# Patient Record
Sex: Male | Born: 1950 | Race: White | Hispanic: No | Marital: Married | Smoking: Never smoker
Health system: Southern US, Community
[De-identification: ages and names within clinical notes are randomized; demographics above are authoritative.]

## PROBLEM LIST (undated history)

## (undated) DIAGNOSIS — E785 Hyperlipidemia, unspecified: Secondary | ICD-10-CM

## (undated) DIAGNOSIS — Z9889 Other specified postprocedural states: Secondary | ICD-10-CM

## (undated) DIAGNOSIS — R7303 Prediabetes: Secondary | ICD-10-CM

## (undated) DIAGNOSIS — T4145XA Adverse effect of unspecified anesthetic, initial encounter: Secondary | ICD-10-CM

## (undated) DIAGNOSIS — K635 Polyp of colon: Secondary | ICD-10-CM

## (undated) DIAGNOSIS — M1990 Unspecified osteoarthritis, unspecified site: Secondary | ICD-10-CM

## (undated) DIAGNOSIS — R112 Nausea with vomiting, unspecified: Secondary | ICD-10-CM

## (undated) DIAGNOSIS — T8859XA Other complications of anesthesia, initial encounter: Secondary | ICD-10-CM

## (undated) DIAGNOSIS — H269 Unspecified cataract: Secondary | ICD-10-CM

## (undated) DIAGNOSIS — E119 Type 2 diabetes mellitus without complications: Secondary | ICD-10-CM

## (undated) DIAGNOSIS — I10 Essential (primary) hypertension: Secondary | ICD-10-CM

## (undated) HISTORY — DX: Essential (primary) hypertension: I10

## (undated) HISTORY — DX: Polyp of colon: K63.5

## (undated) HISTORY — DX: Hyperlipidemia, unspecified: E78.5

## (undated) HISTORY — DX: Type 2 diabetes mellitus without complications: E11.9

## (undated) HISTORY — DX: Prediabetes: R73.03

## (undated) HISTORY — DX: Unspecified cataract: H26.9

## (undated) HISTORY — PX: JOINT REPLACEMENT: SHX530

---
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
## 2015-03-17 DIAGNOSIS — Z96643 Presence of artificial hip joint, bilateral: Secondary | ICD-10-CM | POA: Insufficient documentation

## 2015-07-20 ENCOUNTER — Encounter: Payer: Self-pay | Admitting: Family Medicine

## 2015-07-20 DIAGNOSIS — H269 Unspecified cataract: Secondary | ICD-10-CM | POA: Insufficient documentation

## 2015-07-20 DIAGNOSIS — I10 Essential (primary) hypertension: Secondary | ICD-10-CM | POA: Insufficient documentation

## 2015-07-25 ENCOUNTER — Ambulatory Visit (INDEPENDENT_AMBULATORY_CARE_PROVIDER_SITE_OTHER): Payer: Federal, State, Local not specified - PPO | Admitting: Family Medicine

## 2015-07-25 ENCOUNTER — Encounter: Payer: Self-pay | Admitting: Family Medicine

## 2015-07-25 VITALS — BP 162/84 | HR 74 | Temp 97.9°F | Resp 18 | Ht 72.0 in | Wt 235.0 lb

## 2015-07-25 DIAGNOSIS — I10 Essential (primary) hypertension: Secondary | ICD-10-CM | POA: Diagnosis not present

## 2015-07-25 DIAGNOSIS — Z0000 Encounter for general adult medical examination without abnormal findings: Secondary | ICD-10-CM

## 2015-07-25 DIAGNOSIS — Z7189 Other specified counseling: Secondary | ICD-10-CM | POA: Diagnosis not present

## 2015-07-25 DIAGNOSIS — Z7689 Persons encountering health services in other specified circumstances: Secondary | ICD-10-CM

## 2015-07-25 DIAGNOSIS — K635 Polyp of colon: Secondary | ICD-10-CM | POA: Insufficient documentation

## 2015-07-25 MED ORDER — LISINOPRIL 40 MG PO TABS: 40.0000 mg | ORAL_TABLET | Freq: Every day | ORAL | Status: DC

## 2015-07-25 NOTE — Progress Notes (Signed)
Subjective:    Patient ID: Ricky Lowe, male    DOB: 1950-11-02, 65 y.o.   MRN: [id number]  HPI Patient is here today to establish care. Has a past medical history of hypertension. He has been off medication for quite some time. His blood pressure is elevated today at 162/84. He denies any chest pain shortness of breath or dyspnea on exertion. In the past he was on lisinopril. Colonoscopy was performed in 2014. He did have colon polyps and is due again in 2019. He is overdue for a PSA as well as digital rectal exam. He is also due for the shingles vaccine. Tetanus shot is up-to-date. He is due for hepatitis C screening. Past Medical History  Diagnosis Date  . Cataract   . Hypertension   . Colon polyps     Dr. Sunny Schlein   Past Surgical History  Procedure Laterality Date  . Joint replacement  2010 / 2012    rt hip / lt hip   No current outpatient prescriptions on file prior to visit.   No current facility-administered medications on file prior to visit.   No Known Allergies Social History   Social History  . Marital Status: Married    Spouse Name: N/A  . Number of Children: N/A  . Years of Education: N/A   Occupational History  . Not on file.   Social History Main Topics  . Smoking status: Never Smoker   . Smokeless tobacco: Former Systems developer    Quit date: 07/19/1989  . Alcohol Use: Yes     Comment: occ glass of wine every few months  . Drug Use: No  . Sexual Activity: Yes    Birth Control/ Protection: None   Other Topics Concern  . Not on file   Social History Narrative   Family History  Problem Relation Age of Onset  . Diabetes Mother   . Hearing loss Mother   . Heart disease Mother   . Hyperlipidemia Mother   . Stroke Mother   . Vision loss Mother   . Hypertension Father   . Arthritis Maternal Grandmother       Review of Systems  All other systems reviewed and are negative.      Objective:   Physical Exam  Constitutional: He is oriented to person,  place, and time. He appears well-developed and well-nourished. No distress.  HENT:  Head: Normocephalic and atraumatic.  Right Ear: External ear normal.  Left Ear: External ear normal.  Nose: Nose normal.  Mouth/Throat: Oropharynx is clear and moist. No oropharyngeal exudate.  Eyes: Conjunctivae and EOM are normal. Pupils are equal, round, and reactive to light. Right eye exhibits no discharge. Left eye exhibits no discharge. No scleral icterus.  Neck: Normal range of motion. Neck supple. No JVD present. No tracheal deviation present. No thyromegaly present.  Cardiovascular: Normal rate, regular rhythm, normal heart sounds and intact distal pulses.  Exam reveals no gallop and no friction rub.   No murmur heard. Pulmonary/Chest: Effort normal and breath sounds normal. No stridor. No respiratory distress. He has no wheezes. He has no rales. He exhibits no tenderness.  Abdominal: Soft. Bowel sounds are normal. He exhibits no distension and no mass. There is no tenderness. There is no rebound and no guarding.  Genitourinary: Rectum normal, prostate normal and penis normal.  Musculoskeletal: Normal range of motion. He exhibits no edema or tenderness.  Lymphadenopathy:    He has no cervical adenopathy.  Neurological: He is alert and oriented to  person, place, and time. He has normal reflexes. He displays normal reflexes. No cranial nerve deficit. He exhibits normal muscle tone. Coordination normal.  Skin: Skin is warm. No rash noted. He is not diaphoretic. No erythema. No pallor.  Psychiatric: He has a normal mood and affect. His behavior is normal. Judgment and thought content normal.  Vitals reviewed.         Assessment & Plan:  Establishing care with new doctor, encounter for - Plan: COMPLETE METABOLIC PANEL WITH GFR, CBC with Differential/Platelet, Hepatitis C Ab Reflex HCV RNA, QUANT, PSA, Lipid panel  Benign essential HTN - Plan: lisinopril (PRINIVIL,ZESTRIL) 40 MG tablet  Routine  general medical examination at a health care facility - Plan: COMPLETE METABOLIC PANEL WITH GFR, CBC with Differential/Platelet, Hepatitis C Ab Reflex HCV RNA, QUANT, PSA, Lipid panel  Begin lisinopril 40 mg by mouth daily and recheck blood pressure in one month. I recommended a shingles vaccine. Colonoscopy is up-to-date. I will check a CBC, CMP, fasting lipid panel, and a PSA. I will also screen the patient for hepatitis C.

## 2015-07-26 ENCOUNTER — Encounter: Payer: Self-pay | Admitting: Family Medicine

## 2015-07-26 LAB — LIPID PANEL
CHOL/HDL RATIO: 5.5 ratio — AB (ref <=5.0)
CHOLESTEROL: 205 mg/dL — AB (ref 125–200)
HDL: 37 mg/dL — ABNORMAL LOW (ref >=40)
LDL Cholesterol: 115 mg/dL (ref <130)
Triglycerides: 266 mg/dL — ABNORMAL HIGH (ref <150)
VLDL: 53 mg/dL — ABNORMAL HIGH (ref <30)

## 2015-07-26 LAB — COMPLETE METABOLIC PANEL WITH GFR
ALK PHOS: 47 U/L (ref 40–115)
ALT: 56 U/L — ABNORMAL HIGH (ref 9–46)
AST: 35 U/L (ref 10–35)
Albumin: 4.6 g/dL (ref 3.6–5.1)
BUN: 17 mg/dL (ref 7–25)
CO2: 26 mmol/L (ref 20–31)
Calcium: 9.8 mg/dL (ref 8.6–10.3)
Chloride: 101 mmol/L (ref 98–110)
Creat: 0.92 mg/dL (ref 0.70–1.25)
GFR, Est Non African American: 89 mL/min (ref >=60)
GLUCOSE: 113 mg/dL — AB (ref 70–99)
POTASSIUM: 4.4 mmol/L (ref 3.5–5.3)
SODIUM: 139 mmol/L (ref 135–146)
Total Bilirubin: 0.6 mg/dL (ref 0.2–1.2)
Total Protein: 7.1 g/dL (ref 6.1–8.1)

## 2015-07-26 LAB — CBC WITH DIFFERENTIAL/PLATELET
BASOS ABS: 0 {cells}/uL (ref 0–200)
BASOS PCT: 0 %
EOS ABS: 118 {cells}/uL (ref 15–500)
EOS PCT: 2 %
HCT: 45.4 % (ref 38.5–50.0)
HEMOGLOBIN: 15.3 g/dL (ref 13.0–17.0)
LYMPHS ABS: 1711 {cells}/uL (ref 850–3900)
Lymphocytes Relative: 29 %
MCH: 29.7 pg (ref 27.0–33.0)
MCHC: 33.7 g/dL (ref 32.0–36.0)
MCV: 88 fL (ref 80.0–100.0)
MONOS PCT: 7 %
MPV: 9.6 fL (ref 7.5–12.5)
Monocytes Absolute: 413 cells/uL (ref 200–950)
NEUTROS ABS: 3658 {cells}/uL (ref 1500–7800)
Neutrophils Relative %: 62 %
PLATELETS: 177 10*3/uL (ref 140–400)
RBC: 5.16 MIL/uL (ref 4.20–5.80)
RDW: 13.3 % (ref 11.0–15.0)
WBC: 5.9 10*3/uL (ref 3.8–10.8)

## 2015-07-26 LAB — HEPATITIS C ANTIBODY: HCV Ab: NEGATIVE

## 2015-07-26 LAB — PSA: PSA: 0.58 ng/mL (ref <=4.00)

## 2016-01-27 ENCOUNTER — Encounter: Payer: Self-pay | Admitting: Family Medicine

## 2016-01-27 ENCOUNTER — Ambulatory Visit (INDEPENDENT_AMBULATORY_CARE_PROVIDER_SITE_OTHER): Payer: Federal, State, Local not specified - PPO | Admitting: Family Medicine

## 2016-01-27 VITALS — BP 130/82 | HR 68 | Temp 97.8°F | Resp 16 | Ht 72.0 in | Wt 233.0 lb

## 2016-01-27 DIAGNOSIS — I10 Essential (primary) hypertension: Secondary | ICD-10-CM

## 2016-01-27 DIAGNOSIS — E785 Hyperlipidemia, unspecified: Secondary | ICD-10-CM

## 2016-01-27 DIAGNOSIS — Z23 Encounter for immunization: Secondary | ICD-10-CM

## 2016-01-27 DIAGNOSIS — R7303 Prediabetes: Secondary | ICD-10-CM

## 2016-01-27 NOTE — Progress Notes (Signed)
Subjective:    Patient ID: Ricky Lowe, male    DOB: 03-Jun-1950, 65 y.o.   MRN: [id number]  HPI  07/25/15 Patient is here today to establish care. Has a past medical history of hypertension. He has been off medication for quite some time. His blood pressure is elevated today at 162/84. He denies any chest pain shortness of breath or dyspnea on exertion. In the past he was on lisinopril. Colonoscopy was performed in 2014. He did have colon polyps and is due again in 2019. He is overdue for a PSA as well as digital rectal exam. He is also due for the shingles vaccine. Tetanus shot is up-to-date. He is due for hepatitis C screening.  At that time, my plan was: Begin lisinopril 40 mg by mouth daily and recheck blood pressure in one month. I recommended a shingles vaccine. Colonoscopy is up-to-date. I will check a CBC, CMP, fasting lipid panel, and a PSA. I will also screen the patient for hepatitis C.  01/27/16 His blood pressure has been doing outstanding. Blood pressure at home is typically 125-130/70-80. He is tolerating the lisinopril well without any coughing his lab work at his last visit was significant for prediabetes with an elevated triglyceride and low HDL cholesterol. He admits that he has not been exercising regularly. He is trying to watch his diet some. Past Medical History:  Diagnosis Date  . Cataract   . Colon polyps    Dr. Sunny Schlein  . Hypertension    Past Surgical History:  Procedure Laterality Date  . JOINT REPLACEMENT  2010 / 2012   rt hip / lt hip   Current Outpatient Prescriptions on File Prior to Visit  Medication Sig Dispense Refill  . aspirin 81 MG tablet Take 81 mg by mouth daily.    Marland Kitchen lisinopril (PRINIVIL,ZESTRIL) 40 MG tablet Take 1 tablet (40 mg total) by mouth daily. 90 tablet 3   No current facility-administered medications on file prior to visit.    No Known Allergies Social History   Social History  . Marital status: Married    Spouse name: N/A  .  Number of children: N/A  . Years of education: N/A   Occupational History  . Not on file.   Social History Main Topics  . Smoking status: Never Smoker  . Smokeless tobacco: Former Systems developer    Quit date: 07/19/1989  . Alcohol use Yes     Comment: occ glass of wine every few months  . Drug use: No  . Sexual activity: Yes    Birth control/ protection: None   Other Topics Concern  . Not on file   Social History Narrative  . No narrative on file   Family History  Problem Relation Age of Onset  . Diabetes Mother   . Hearing loss Mother   . Heart disease Mother   . Hyperlipidemia Mother   . Stroke Mother   . Vision loss Mother   . Hypertension Father   . Arthritis Maternal Grandmother       Review of Systems  All other systems reviewed and are negative.      Objective:   Physical Exam  Constitutional: He is oriented to person, place, and time. He appears well-developed and well-nourished. No distress.  HENT:  Head: Normocephalic and atraumatic.  Right Ear: External ear normal.  Left Ear: External ear normal.  Nose: Nose normal.  Mouth/Throat: Oropharynx is clear and moist. No oropharyngeal exudate.  Eyes: Conjunctivae and EOM are normal.  Pupils are equal, round, and reactive to light. Right eye exhibits no discharge. Left eye exhibits no discharge. No scleral icterus.  Neck: Normal range of motion. Neck supple. No JVD present. No tracheal deviation present. No thyromegaly present.  Cardiovascular: Normal rate, regular rhythm, normal heart sounds and intact distal pulses.  Exam reveals no gallop and no friction rub.   No murmur heard. Pulmonary/Chest: Effort normal and breath sounds normal. No stridor. No respiratory distress. He has no wheezes. He has no rales. He exhibits no tenderness.  Abdominal: Soft. Bowel sounds are normal. He exhibits no distension and no mass. There is no tenderness. There is no rebound and no guarding.  Genitourinary: Rectum normal, prostate normal  and penis normal.  Musculoskeletal: Normal range of motion. He exhibits no edema or tenderness.  Lymphadenopathy:    He has no cervical adenopathy.  Neurological: He is alert and oriented to person, place, and time. He has normal reflexes. No cranial nerve deficit. He exhibits normal muscle tone. Coordination normal.  Skin: Skin is warm. No rash noted. He is not diaphoretic. No erythema. No pallor.  Psychiatric: He has a normal mood and affect. His behavior is normal. Judgment and thought content normal.  Vitals reviewed.         Assessment & Plan:  Essential hypertension, dyslipidemia, prediabetes.  Blood pressure is excellent. I'll make no changes in his lisinopril dose. Return fasting to check a CMP, fasting lipid panel, and hemoglobin A1c. We discussed low carbohydrate diet today as well as 30 minutes a day 5 days a week of aerobic exercise to help manage his prediabetes and his dyslipidemia. He received his flu shot today

## 2016-01-27 NOTE — Addendum Note (Signed)
Addended by: Shary Decamp B on: 01/27/2016 12:59 PM   Modules accepted: Orders

## 2016-02-01 ENCOUNTER — Other Ambulatory Visit: Payer: Federal, State, Local not specified - PPO

## 2016-02-01 DIAGNOSIS — R7303 Prediabetes: Secondary | ICD-10-CM | POA: Diagnosis not present

## 2016-02-01 LAB — COMPLETE METABOLIC PANEL WITH GFR
ALBUMIN: 4.6 g/dL (ref 3.6–5.1)
ALK PHOS: 41 U/L (ref 40–115)
ALT: 51 U/L — ABNORMAL HIGH (ref 9–46)
AST: 74 U/L — AB (ref 10–35)
BILIRUBIN TOTAL: 0.6 mg/dL (ref 0.2–1.2)
BUN: 14 mg/dL (ref 7–25)
CALCIUM: 9.4 mg/dL (ref 8.6–10.3)
CO2: 27 mmol/L (ref 20–31)
CREATININE: 0.95 mg/dL (ref 0.70–1.25)
Chloride: 102 mmol/L (ref 98–110)
GFR, Est African American: >89 mL/min (ref >=60)
GFR, Est Non African American: 85 mL/min (ref >=60)
Glucose, Bld: 109 mg/dL — ABNORMAL HIGH (ref 70–99)
POTASSIUM: 4.3 mmol/L (ref 3.5–5.3)
Sodium: 138 mmol/L (ref 135–146)
TOTAL PROTEIN: 6.7 g/dL (ref 6.1–8.1)

## 2016-02-01 LAB — CBC WITH DIFFERENTIAL/PLATELET
BASOS ABS: 51 {cells}/uL (ref 0–200)
Basophils Relative: 1 %
Eosinophils Absolute: 153 cells/uL (ref 15–500)
Eosinophils Relative: 3 %
HEMATOCRIT: 45.4 % (ref 38.5–50.0)
HEMOGLOBIN: 15.3 g/dL (ref 13.0–17.0)
LYMPHS ABS: 1938 {cells}/uL (ref 850–3900)
Lymphocytes Relative: 38 %
MCH: 29.6 pg (ref 27.0–33.0)
MCHC: 33.7 g/dL (ref 32.0–36.0)
MCV: 87.8 fL (ref 80.0–100.0)
MONO ABS: 459 {cells}/uL (ref 200–950)
MPV: 9.3 fL (ref 7.5–12.5)
Monocytes Relative: 9 %
NEUTROS ABS: 2499 {cells}/uL (ref 1500–7800)
NEUTROS PCT: 49 %
Platelets: 174 10*3/uL (ref 140–400)
RBC: 5.17 MIL/uL (ref 4.20–5.80)
RDW: 12.6 % (ref 11.0–15.0)
WBC: 5.1 10*3/uL (ref 3.8–10.8)

## 2016-02-01 LAB — LIPID PANEL
Cholesterol: 210 mg/dL — ABNORMAL HIGH (ref 125–200)
HDL: 39 mg/dL — ABNORMAL LOW (ref >=40)
LDL Cholesterol: 142 mg/dL — ABNORMAL HIGH (ref <130)
Total CHOL/HDL Ratio: 5.4 Ratio — ABNORMAL HIGH (ref <=5.0)
Triglycerides: 144 mg/dL (ref <150)
VLDL: 29 mg/dL (ref <30)

## 2016-02-02 LAB — HEMOGLOBIN A1C
Hgb A1c MFr Bld: 5.5 % (ref <5.7)
MEAN PLASMA GLUCOSE: 111 mg/dL

## 2016-02-08 ENCOUNTER — Other Ambulatory Visit: Payer: Self-pay | Admitting: Family Medicine

## 2016-02-08 DIAGNOSIS — R945 Abnormal results of liver function studies: Secondary | ICD-10-CM

## 2016-02-08 NOTE — Addendum Note (Signed)
Addended by: Shary Decamp B on: 02/08/2016 11:09 AM   Modules accepted: Orders

## 2016-02-15 ENCOUNTER — Ambulatory Visit (HOSPITAL_COMMUNITY)
Admission: RE | Admit: 2016-02-15 | Discharge: 2016-02-15 | Disposition: A | Discharge status: Home / Self Care | Payer: Federal, State, Local not specified - PPO | Source: Ambulatory Visit | Attending: Family Medicine | Admitting: Family Medicine

## 2016-02-15 DIAGNOSIS — R945 Abnormal results of liver function studies: Secondary | ICD-10-CM | POA: Diagnosis not present

## 2016-02-15 DIAGNOSIS — R7989 Other specified abnormal findings of blood chemistry: Secondary | ICD-10-CM | POA: Diagnosis not present

## 2016-02-15 DIAGNOSIS — K760 Fatty (change of) liver, not elsewhere classified: Secondary | ICD-10-CM | POA: Insufficient documentation

## 2016-02-17 ENCOUNTER — Encounter: Payer: Self-pay | Admitting: Family Medicine

## 2016-08-02 ENCOUNTER — Other Ambulatory Visit: Payer: Self-pay | Admitting: Family Medicine

## 2016-08-02 DIAGNOSIS — I10 Essential (primary) hypertension: Secondary | ICD-10-CM

## 2016-08-08 ENCOUNTER — Ambulatory Visit (INDEPENDENT_AMBULATORY_CARE_PROVIDER_SITE_OTHER): Payer: Federal, State, Local not specified - PPO | Admitting: Family Medicine

## 2016-08-08 ENCOUNTER — Encounter: Payer: Self-pay | Admitting: Family Medicine

## 2016-08-08 VITALS — BP 136/82 | HR 66 | Temp 98.4°F | Resp 12 | Ht 72.0 in | Wt 226.0 lb

## 2016-08-08 DIAGNOSIS — R509 Fever, unspecified: Secondary | ICD-10-CM | POA: Diagnosis not present

## 2016-08-08 DIAGNOSIS — R590 Localized enlarged lymph nodes: Secondary | ICD-10-CM

## 2016-08-08 DIAGNOSIS — M2550 Pain in unspecified joint: Secondary | ICD-10-CM

## 2016-08-08 DIAGNOSIS — J020 Streptococcal pharyngitis: Secondary | ICD-10-CM | POA: Diagnosis not present

## 2016-08-08 DIAGNOSIS — N529 Male erectile dysfunction, unspecified: Secondary | ICD-10-CM

## 2016-08-08 LAB — STREP GROUP A AG, W/REFLEX TO CULT: STREGTOCOCCUS GROUP A AG SCREEN: DETECTED — AB

## 2016-08-08 MED ORDER — AMOXICILLIN 500 MG PO CAPS: 500.0000 mg | ORAL_CAPSULE | Freq: Two times a day (BID) | ORAL | 0 refills | Status: DC

## 2016-08-08 MED ORDER — SILDENAFIL CITRATE 100 MG PO TABS: 100.0000 mg | ORAL_TABLET | Freq: Every day | ORAL | 2 refills | Status: DC | PRN

## 2016-08-08 NOTE — Patient Instructions (Addendum)
Start amoxicillin for 10 days  We will call with lab results I a checking for lyme, bacterial infection, autoimmune disorder F/U pending results  Rachael Darby- Due for Physical/Fasting labs

## 2016-08-08 NOTE — Progress Notes (Signed)
Subjective:    Patient ID: Ricky Lowe, male    DOB: [birth year], 66 y.o.   MRN: [id number]  Patient presents for Illness (x4 days- fever/ chills, swollen lymph nodes in throat, sore throat, joint pain, HA- is concerned b/c he has had lime disease in past)  Recurrent episodes of fever, chills, fatigue , joint pain over the past 2 months. Last eipsode started on Monday, with fever, sore throat, swollen lymph nodes. No known sick contacts  He also gets swelling in hands and stiffness in joints Works outside, no recent tick bites but has had lyme disesae with class rash presentation about 10 years ago.  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  His immuniations are utd Each episodes he has taken ibuprofen which helps the joiunt pain Currently being treated with lisinopril for HTN and he has changed diet, on [employer] for fatty liver disesae the past 3 months    Review Of Systems:  GEN- denies fatigue, +fever, weight loss,weakness, recent illness HEENT- denies eye drainage, change in vision, nasal discharge, CVS- denies chest pain, palpitations RESP- denies SOB, +cough, wheeze ABD- denies N/V, change in stools, abd pain GU- denies dysuria, hematuria, dribbling, incontinence MSK- + joint pain, =muscle aches, injury Neuro- denies headache, dizziness, syncope, seizure activity       Objective:    BP 136/82   Pulse 66   Temp 98.4 F (36.9 C) (Oral)   Resp 12   Ht 6' (1.829 m)   Wt 226 lb (102.5 kg)   SpO2 99%   BMI 30.65 kg/m  GEN- NAD, alert and oriented x3 HEENT- PERRL, EOMI, non injected sclera, pink conjunctiva, MMM, oropharynx mild injection,  Small pustule right tonsilTM clear bilat no effusion,no maxillary sinus tenderness,nares clear  Neck- Supple,   ,moderate size submandibular and ant LAD CVS- RRR, no murmur RESP-CTAB ABD-NABS,sof  NT,ND, no HSM MSK- few small nodules at DIP 4th digits, nodule left wrist Skin- in tact no rash  EXT- No edema Pulses- Radial 2+  + strep        Assessment & Plan:      Problem List Items Addressed This Visit    Erectile dysfunction    Other Visit Diagnoses    Cervical lymphadenopathy    -  Primary   Relevant Orders   CBC with Differential/Platelet   Comprehensive metabolic panel   Epstein-Barr Virus VCA Antibody Panel   STREP GROUP A AG, W/REFLEX TO CULT (Completed)   Strep pharyngitis       Treat positive strep, which accounts for this illness but not the intermittant fevers joint swelling or stiffness, amox x 10 days   Relevant Orders   CBC with Differential/Platelet   Comprehensive metabolic panel   Rheumatoid factor   Sedimentation rate   C-reactive protein   B. Burgdorfi Antibodies by WB   Epstein-Barr Virus VCA Antibody Panel   STREP GROUP A AG, W/REFLEX TO CULT (Completed)   Arthralgia, unspecified joint  (Chronic)      Check Autoimmune labs, EBV/Lyme titers for recurrent episodes of fever, joint swelling and node swelling Also with his early hip swelling an nodules on hand and wrist noted past few years    Relevant Orders  Rheumatoid factor   Sedimentation rate   C-reactive protein   B. Burgdorfi Antibodies by WB      Note: This dictation was prepared with Dragon dictation along with smaller phrase technology. Any transcriptional errors that result from this process are unintentional.

## 2016-08-09 LAB — COMPREHENSIVE METABOLIC PANEL
ALBUMIN: 4.2 g/dL (ref 3.6–5.1)
ALT: 49 U/L — ABNORMAL HIGH (ref 9–46)
AST: 36 U/L — AB (ref 10–35)
Alkaline Phosphatase: 43 U/L (ref 40–115)
BILIRUBIN TOTAL: 0.5 mg/dL (ref 0.2–1.2)
BUN: 16 mg/dL (ref 7–25)
CALCIUM: 9.5 mg/dL (ref 8.6–10.3)
CO2: 25 mmol/L (ref 20–31)
Chloride: 103 mmol/L (ref 98–110)
Creat: 1.05 mg/dL (ref 0.70–1.25)
Glucose, Bld: 89 mg/dL (ref 70–99)
Potassium: 4.7 mmol/L (ref 3.5–5.3)
Sodium: 139 mmol/L (ref 135–146)
Total Protein: 6.6 g/dL (ref 6.1–8.1)

## 2016-08-09 LAB — EPSTEIN-BARR VIRUS VCA ANTIBODY PANEL: EBV NA IgG: 171 U/mL — ABNORMAL HIGH

## 2016-08-09 LAB — CBC WITH DIFFERENTIAL/PLATELET
BASOS ABS: 0 {cells}/uL (ref 0–200)
Basophils Relative: 0 %
Eosinophils Absolute: 204 cells/uL (ref 15–500)
Eosinophils Relative: 3 %
HEMATOCRIT: 40.7 % (ref 38.5–50.0)
HEMOGLOBIN: 13.7 g/dL (ref 13.0–17.0)
LYMPHS ABS: 1632 {cells}/uL (ref 850–3900)
Lymphocytes Relative: 24 %
MCH: 29.7 pg (ref 27.0–33.0)
MCHC: 33.7 g/dL (ref 32.0–36.0)
MCV: 88.1 fL (ref 80.0–100.0)
MONO ABS: 748 {cells}/uL (ref 200–950)
MPV: 10.1 fL (ref 7.5–12.5)
Monocytes Relative: 11 %
NEUTROS PCT: 62 %
Neutro Abs: 4216 cells/uL (ref 1500–7800)
Platelets: 177 10*3/uL (ref 140–400)
RBC: 4.62 MIL/uL (ref 4.20–5.80)
RDW: 13 % (ref 11.0–15.0)
WBC: 6.8 10*3/uL (ref 3.8–10.8)

## 2016-08-09 LAB — SEDIMENTATION RATE: SED RATE: 11 mm/h (ref 0–20)

## 2016-08-09 LAB — RHEUMATOID FACTOR

## 2016-08-09 LAB — C-REACTIVE PROTEIN: CRP: 64.1 mg/L — AB (ref <8.0)

## 2016-08-10 LAB — LYME ABY, WSTRN BLT IGG & IGM W/BANDS
B BURGDORFERI IGG ABS (IB): NEGATIVE
B BURGDORFERI IGM ABS (IB): NEGATIVE
LYME DISEASE 23 KD IGG: NONREACTIVE
LYME DISEASE 23 KD IGM: NONREACTIVE
LYME DISEASE 39 KD IGG: NONREACTIVE
LYME DISEASE 41 KD IGG: NONREACTIVE
LYME DISEASE 45 KD IGG: NONREACTIVE
LYME DISEASE 93 KD IGG: NONREACTIVE
Lyme Disease 18 kD IgG: NONREACTIVE
Lyme Disease 28 kD IgG: NONREACTIVE
Lyme Disease 30 kD IgG: NONREACTIVE
Lyme Disease 39 kD IgM: NONREACTIVE
Lyme Disease 41 kD IgM: NONREACTIVE
Lyme Disease 58 kD IgG: NONREACTIVE
Lyme Disease 66 kD IgG: NONREACTIVE

## 2016-10-01 ENCOUNTER — Other Ambulatory Visit: Payer: Self-pay | Admitting: Family Medicine

## 2016-10-01 MED ORDER — AMOXICILLIN 500 MG PO CAPS: 500.0000 mg | ORAL_CAPSULE | Freq: Two times a day (BID) | ORAL | 0 refills | Status: DC

## 2016-10-01 NOTE — Progress Notes (Signed)
Pt wife here, he is working and unable to come in Has sinus pressure drainage for past 2 weeks, no fever Using OTC meds not clearing  Will Rx amox, can use nasal steroid/nasal saline, anti-histamine  He should schedule visit if not improving

## 2016-10-05 ENCOUNTER — Telehealth: Payer: Self-pay | Admitting: Family Medicine

## 2016-10-05 NOTE — Telephone Encounter (Signed)
Patients wife Marcie Bal calling regarding some issues he is having maybe from the amoxicillin that was prescribed  Please call her at [phone]

## 2016-10-09 NOTE — Telephone Encounter (Signed)
I spoke with Ricky Lowe and he stated he was not having any problems with the amoxicillin. When I informed pt that his wife had called pt states he wasn't sure why she would call because he was not having any problems with the medication.

## 2016-11-08 DIAGNOSIS — D3131 Benign neoplasm of right choroid: Secondary | ICD-10-CM | POA: Diagnosis not present

## 2016-11-08 DIAGNOSIS — H25813 Combined forms of age-related cataract, bilateral: Secondary | ICD-10-CM | POA: Diagnosis not present

## 2016-11-29 ENCOUNTER — Encounter (INDEPENDENT_AMBULATORY_CARE_PROVIDER_SITE_OTHER): Payer: Federal, State, Local not specified - PPO | Admitting: Ophthalmology

## 2016-11-29 DIAGNOSIS — H35033 Hypertensive retinopathy, bilateral: Secondary | ICD-10-CM | POA: Diagnosis not present

## 2016-11-29 DIAGNOSIS — H2513 Age-related nuclear cataract, bilateral: Secondary | ICD-10-CM

## 2016-11-29 DIAGNOSIS — H43813 Vitreous degeneration, bilateral: Secondary | ICD-10-CM

## 2016-11-29 DIAGNOSIS — D3131 Benign neoplasm of right choroid: Secondary | ICD-10-CM | POA: Diagnosis not present

## 2016-11-29 DIAGNOSIS — I10 Essential (primary) hypertension: Secondary | ICD-10-CM | POA: Diagnosis not present

## 2016-12-10 NOTE — Patient Instructions (Signed)
Your procedure is scheduled on: 12/21/2016   Report to [facility] at   850   AM.  Call this number if you have problems the morning of surgery: [phone]   Do not eat food or drink liquids :After Midnight.      Take these medicines the morning of surgery with A SIP OF WATER: lisinopril   Do not wear jewelry, make-up or nail polish.  Do not wear lotions, powders, or perfumes. You may wear deodorant.  Do not shave 48 hours prior to surgery.  Do not bring valuables to the hospital.  Contacts, dentures or bridgework may not be worn into surgery.  Leave suitcase in the car. After surgery it may be brought to your room.  For patients admitted to the hospital, checkout time is 11:00 AM the day of discharge.   Patients discharged the day of surgery will not be allowed to drive home.  :     Please read over the following fact sheets that you were given: Coughing and Deep Breathing, Surgical Site Infection Prevention, Anesthesia Post-op Instructions and Care and Recovery After Surgery    Cataract A cataract is a clouding of the lens of the eye. When a lens becomes cloudy, vision is reduced based on the degree and nature of the clouding. Many cataracts reduce vision to some degree. Some cataracts make people more near-sighted as they develop. Other cataracts increase glare. Cataracts that are ignored and become worse can sometimes look white. The white color can be seen through the pupil. CAUSES   Aging. However, cataracts may occur at any age, even in newborns.   Certain drugs.   Trauma to the eye.   Certain diseases such as diabetes.   Specific eye diseases such as chronic inflammation inside the eye or a sudden attack of a rare form of glaucoma.   Inherited or acquired medical problems.  SYMPTOMS   Gradual, progressive drop in vision in the affected eye.   Severe, rapid visual loss. This most often happens when trauma is the cause.  DIAGNOSIS  To detect a cataract, an eye doctor  examines the lens. Cataracts are best diagnosed with an exam of the eyes with the pupils enlarged (dilated) by drops.  TREATMENT  For an early cataract, vision may improve by using different eyeglasses or stronger lighting. If that does not help your vision, surgery is the only effective treatment. A cataract needs to be surgically removed when vision loss interferes with your everyday activities, such as driving, reading, or watching TV. A cataract may also have to be removed if it prevents examination or treatment of another eye problem. Surgery removes the cloudy lens and usually replaces it with a substitute lens (intraocular lens, IOL).  At a time when both you and your doctor agree, the cataract will be surgically removed. If you have cataracts in both eyes, only one is usually removed at a time. This allows the operated eye to heal and be out of danger from any possible problems after surgery (such as infection or poor wound healing). In rare cases, a cataract may be doing damage to your eye. In these cases, your caregiver may advise surgical removal right away. The vast majority of people who have cataract surgery have better vision afterward. HOME CARE INSTRUCTIONS  If you are not planning surgery, you may be asked to do the following:  Use different eyeglasses.   Use stronger or brighter lighting.   Ask your eye doctor about reducing your  medicine dose or changing medicines if it is thought that a medicine caused your cataract. Changing medicines does not make the cataract go away on its own.   Become familiar with your surroundings. Poor vision can lead to injury. Avoid bumping into things on the affected side. You are at a higher risk for tripping or falling.   Exercise extreme care when driving or operating machinery.   Wear sunglasses if you are sensitive to bright light or experiencing problems with glare.  SEEK IMMEDIATE MEDICAL CARE IF:   You have a worsening or sudden vision  loss.   You notice redness, swelling, or increasing pain in the eye.   You have a fever.  Document Released: 04/02/2005 Document Revised: 03/22/2011 Document Reviewed: 11/24/2010 [facility] Patient Information 2012 [employer].PATIENT INSTRUCTIONS POST-ANESTHESIA  IMMEDIATELY FOLLOWING SURGERY:  Do not drive or operate machinery for the first twenty four hours after surgery.  Do not make any important decisions for twenty four hours after surgery or while taking narcotic pain medications or sedatives.  If you develop intractable nausea and vomiting or a severe headache please notify your doctor immediately.  FOLLOW-UP:  Please make an appointment with your surgeon as instructed. You do not need to follow up with anesthesia unless specifically instructed to do so.  WOUND CARE INSTRUCTIONS (if applicable):  Keep a dry clean dressing on the anesthesia/puncture wound site if there is drainage.  Once the wound has quit draining you may leave it open to air.  Generally you should leave the bandage intact for twenty four hours unless there is drainage.  If the epidural site drains for more than 36-48 hours please call the anesthesia department.  QUESTIONS?:  Please feel free to call your physician or the hospital operator if you have any questions, and they will be happy to assist you.

## 2016-12-14 ENCOUNTER — Other Ambulatory Visit: Payer: Self-pay

## 2016-12-14 ENCOUNTER — Encounter (HOSPITAL_COMMUNITY)
Admission: RE | Admit: 2016-12-14 | Discharge: 2016-12-14 | Disposition: A | Discharge status: Home / Self Care | Payer: Federal, State, Local not specified - PPO | Source: Ambulatory Visit | Attending: Ophthalmology | Admitting: Ophthalmology

## 2016-12-14 ENCOUNTER — Encounter (HOSPITAL_COMMUNITY): Payer: Self-pay

## 2016-12-14 DIAGNOSIS — Z01818 Encounter for other preprocedural examination: Secondary | ICD-10-CM | POA: Diagnosis not present

## 2016-12-14 DIAGNOSIS — H25812 Combined forms of age-related cataract, left eye: Secondary | ICD-10-CM | POA: Diagnosis not present

## 2016-12-14 HISTORY — DX: Unspecified osteoarthritis, unspecified site: M19.90

## 2016-12-21 ENCOUNTER — Ambulatory Visit (HOSPITAL_COMMUNITY): Payer: Federal, State, Local not specified - PPO | Admitting: Anesthesiology

## 2016-12-21 ENCOUNTER — Encounter (HOSPITAL_COMMUNITY): Payer: Self-pay | Admitting: Anesthesiology

## 2016-12-21 ENCOUNTER — Ambulatory Visit (HOSPITAL_COMMUNITY)
Admission: RE | Admit: 2016-12-21 | Discharge: 2016-12-21 | Disposition: A | Discharge status: Home / Self Care | Payer: Federal, State, Local not specified - PPO | Source: Ambulatory Visit | Attending: Ophthalmology | Admitting: Ophthalmology

## 2016-12-21 ENCOUNTER — Encounter (HOSPITAL_COMMUNITY)
Admission: RE | Disposition: A | Discharge status: Home / Self Care | Payer: Self-pay | Source: Ambulatory Visit | Attending: Ophthalmology

## 2016-12-21 DIAGNOSIS — E7800 Pure hypercholesterolemia, unspecified: Secondary | ICD-10-CM | POA: Insufficient documentation

## 2016-12-21 DIAGNOSIS — H2512 Age-related nuclear cataract, left eye: Secondary | ICD-10-CM | POA: Diagnosis not present

## 2016-12-21 DIAGNOSIS — H25811 Combined forms of age-related cataract, right eye: Secondary | ICD-10-CM | POA: Diagnosis not present

## 2016-12-21 DIAGNOSIS — Z79899 Other long term (current) drug therapy: Secondary | ICD-10-CM | POA: Diagnosis not present

## 2016-12-21 DIAGNOSIS — H269 Unspecified cataract: Secondary | ICD-10-CM | POA: Insufficient documentation

## 2016-12-21 DIAGNOSIS — H25812 Combined forms of age-related cataract, left eye: Secondary | ICD-10-CM | POA: Diagnosis not present

## 2016-12-21 DIAGNOSIS — I10 Essential (primary) hypertension: Secondary | ICD-10-CM | POA: Insufficient documentation

## 2016-12-21 HISTORY — PX: CATARACT EXTRACTION W/PHACO: SHX586

## 2016-12-21 SURGERY — PHACOEMULSIFICATION, CATARACT, WITH IOL INSERTION
Anesthesia: Monitor Anesthesia Care | Site: Eye | Laterality: Left

## 2016-12-21 MED ORDER — POVIDONE-IODINE 5 % OP SOLN
OPHTHALMIC | Status: DC | PRN
  Administered 2016-12-21: 1 via OPHTHALMIC

## 2016-12-21 MED ORDER — NEOMYCIN-POLYMYXIN-DEXAMETH 3.5-10000-0.1 OP SUSP
OPHTHALMIC | Status: DC | PRN
  Administered 2016-12-21: 2 [drp] via OPHTHALMIC

## 2016-12-21 MED ORDER — FENTANYL CITRATE (PF) 100 MCG/2ML IJ SOLN
25.0000 ug | Freq: Once | INTRAMUSCULAR | Status: AC
  Administered 2016-12-21: 25 ug via INTRAVENOUS

## 2016-12-21 MED ORDER — BSS IO SOLN
INTRAOCULAR | Status: DC | PRN
  Administered 2016-12-21: 15 mL

## 2016-12-21 MED ORDER — PHENYLEPHRINE HCL 2.5 % OP SOLN
1.0000 [drp] | OPHTHALMIC | Status: AC
Start: 2016-12-21 — End: 2016-12-21
  Administered 2016-12-21 (×3): 1 [drp] via OPHTHALMIC

## 2016-12-21 MED ORDER — EPINEPHRINE PF 1 MG/ML IJ SOLN
INTRAMUSCULAR | Status: DC | PRN
  Administered 2016-12-21: 500 mL

## 2016-12-21 MED ORDER — MIDAZOLAM HCL 2 MG/2ML IJ SOLN
INTRAMUSCULAR | Status: AC
  Filled 2016-12-21: qty 2

## 2016-12-21 MED ORDER — EPINEPHRINE PF 1 MG/ML IJ SOLN
INTRAOCULAR | Status: DC | PRN
  Administered 2016-12-21: 1 mL via OPHTHALMIC

## 2016-12-21 MED ORDER — PROVISC 10 MG/ML IO SOLN
INTRAOCULAR | Status: DC | PRN
  Administered 2016-12-21: 0.85 mL via INTRAOCULAR

## 2016-12-21 MED ORDER — CYCLOPENTOLATE-PHENYLEPHRINE 0.2-1 % OP SOLN
1.0000 [drp] | OPHTHALMIC | Status: AC
Start: 2016-12-21 — End: 2016-12-21
  Administered 2016-12-21 (×3): 1 [drp] via OPHTHALMIC

## 2016-12-21 MED ORDER — TETRACAINE HCL 0.5 % OP SOLN
1.0000 [drp] | OPHTHALMIC | Status: AC | PRN
  Administered 2016-12-21 (×3): 1 [drp] via OPHTHALMIC

## 2016-12-21 MED ORDER — SODIUM HYALURONATE 23 MG/ML IO SOLN
INTRAOCULAR | Status: DC | PRN
  Administered 2016-12-21: 0.6 mL via INTRAOCULAR

## 2016-12-21 MED ORDER — LIDOCAINE HCL 3.5 % OP GEL
1.0000 "application " | Freq: Once | OPHTHALMIC | Status: AC
  Administered 2016-12-21: 1 via OPHTHALMIC

## 2016-12-21 MED ORDER — LACTATED RINGERS IV SOLN
INTRAVENOUS | Status: DC
  Administered 2016-12-21: 1000 mL via INTRAVENOUS

## 2016-12-21 MED ORDER — FENTANYL CITRATE (PF) 100 MCG/2ML IJ SOLN
INTRAMUSCULAR | Status: AC
  Filled 2016-12-21: qty 2

## 2016-12-21 MED ORDER — MIDAZOLAM HCL 2 MG/2ML IJ SOLN
1.0000 mg | INTRAMUSCULAR | Status: AC
  Administered 2016-12-21: 2 mg via INTRAVENOUS

## 2016-12-21 SURGICAL SUPPLY — 12 items
CLOTH BEACON ORANGE TIMEOUT ST (SAFETY) ×2 IMPLANT
EYE SHIELD UNIVERSAL CLEAR (GAUZE/BANDAGES/DRESSINGS) ×2 IMPLANT
GLOVE BIOGEL PI IND STRL 6.5 (GLOVE) ×2 IMPLANT
GLOVE BIOGEL PI INDICATOR 6.5 (GLOVE) ×2
LENS ALC ACRYL/TECN (Ophthalmic Related) ×2 IMPLANT
NEEDLE HYPO 18GX1.5 BLUNT FILL (NEEDLE) ×2 IMPLANT
PAD ARMBOARD 7.5X6 YLW CONV (MISCELLANEOUS) ×2 IMPLANT
SYR TB 1ML LL NO SAFETY (SYRINGE) ×2 IMPLANT
TAPE SURG TRANSPORE 1 IN (GAUZE/BANDAGES/DRESSINGS) ×1 IMPLANT
TAPE SURGICAL TRANSPORE 1 IN (GAUZE/BANDAGES/DRESSINGS) ×1
VISCOELASTIC ADDITIONAL (OPHTHALMIC RELATED) ×2 IMPLANT
WATER STERILE IRR 250ML POUR (IV SOLUTION) ×2 IMPLANT

## 2016-12-21 NOTE — Anesthesia Preprocedure Evaluation (Signed)
Anesthesia Evaluation  Patient identified by MRN, date of birth, ID band Patient awake    Reviewed: Allergy & Precautions, NPO status , Patient's Chart, lab work & pertinent test results  Airway Mallampati: I   Neck ROM: Full    Dental  (+) Teeth Intact   Pulmonary neg pulmonary ROS,    breath sounds clear to auscultation       Cardiovascular hypertension, Pt. on medications  Rhythm:Regular Rate:Normal     Neuro/Psych negative neurological ROS  negative psych ROS   GI/Hepatic negative GI ROS,   Endo/Other  diabetes (pre DM)  Renal/GU      Musculoskeletal   Abdominal   Peds  Hematology   Anesthesia Other Findings   Reproductive/Obstetrics                             Anesthesia Physical Anesthesia Plan  ASA: II  Anesthesia Plan: MAC   Post-op Pain Management:    Induction: Intravenous  PONV Risk Score and Plan:   Airway Management Planned: Nasal Cannula  Additional Equipment:   Intra-op Plan:   Post-operative Plan:   Informed Consent: I have reviewed the patients History and Physical, chart, labs and discussed the procedure including the risks, benefits and alternatives for the proposed anesthesia with the patient or authorized representative who has indicated his/her understanding and acceptance.     Plan Discussed with:   Anesthesia Plan Comments:         Anesthesia Quick Evaluation  

## 2016-12-21 NOTE — Anesthesia Postprocedure Evaluation (Signed)
Anesthesia Post Note  Patient: Ricky Lowe  Procedure(s) Performed: Procedure(s) (LRB): CATARACT EXTRACTION PHACO AND INTRAOCULAR LENS PLACEMENT (IOC) (Left)  Patient location during evaluation: Short Stay Anesthesia Type: MAC Level of consciousness: awake and alert and oriented Pain management: pain level controlled Vital Signs Assessment: post-procedure vital signs reviewed and stable Respiratory status: spontaneous breathing Postop Assessment: no signs of nausea or vomiting Anesthetic complications: no     Last Vitals:  Vitals:   12/21/16 0950 12/21/16 0955  BP: (!) 98/57 (!) 97/55  Resp: (!) 21 16  SpO2: 98% 98%    Last Pain: There were no vitals filed for this visit.               Leslee Suire

## 2016-12-21 NOTE — Discharge Instructions (Signed)
PATIENT INSTRUCTIONS POST-ANESTHESIA  IMMEDIATELY FOLLOWING SURGERY:  Do not drive or operate machinery for the first twenty four hours after surgery.  Do not make any important decisions for twenty four hours after surgery or while taking narcotic pain medications or sedatives.  If you develop intractable nausea and vomiting or a severe headache please notify your doctor immediately.  FOLLOW-UP:  Please make an appointment with your surgeon as instructed. You do not need to follow up with anesthesia unless specifically instructed to do so.  WOUND CARE INSTRUCTIONS (if applicable):  Keep a dry clean dressing on the anesthesia/puncture wound site if there is drainage.  Once the wound has quit draining you may leave it open to air.  Generally you should leave the bandage intact for twenty four hours unless there is drainage.  If the epidural site drains for more than 36-48 hours please call the anesthesia department.  QUESTIONS?:  Please feel free to call your physician or the hospital operator if you have any questions, and they will be happy to assist you.      Please discharge patient when stable, will follow up today with Dr. Marisa Hua at the [facility] office at 11:50AM.  Leave shield in place until visit.  All paperwork with discharge instructions will be given at the office.

## 2016-12-21 NOTE — H&P (Signed)
The H and P was reviewed and updated. The patient was examined.  No changes were found after exam.  The surgical eye was marked.  

## 2016-12-21 NOTE — Transfer of Care (Signed)
Immediate Anesthesia Transfer of Care Note  Patient: Ricky Lowe  Procedure(s) Performed: Procedure(s) with comments: CATARACT EXTRACTION PHACO AND INTRAOCULAR LENS PLACEMENT (IOC) (Left) - CDE: 2.63  Patient Location: PACU  Anesthesia Type:MAC  Level of Consciousness: awake, alert  and oriented  Airway & Oxygen Therapy: Patient Spontanous Breathing  Post-op Assessment: Report given to RN  Post vital signs: Reviewed and stable  Last Vitals:  Vitals:   12/21/16 0950 12/21/16 0955  BP: (!) 98/57 (!) 97/55  Resp: (!) 21 16  SpO2: 98% 98%    Last Pain: There were no vitals filed for this visit.       Complications: No apparent anesthesia complications

## 2016-12-21 NOTE — Op Note (Signed)
Date of procedure: 12/21/16  Pre-operative diagnosis: Visually significant cataract, Left Eye  Post-operative diagnosis: Visually significant cataract, Left Eye  Procedure: Removal of cataract via phacoemulsification and insertion of intra-ocular lens AMO PCB00  +22.0D into the capsular bag of the Left Eye  Attending surgeon: Gerda Diss. Maxxwell Edgett, MD, MA  Anesthesia: MAC, Topical Akten  Complications: None  Estimated Blood Loss: <30m (minimal)  Specimens: None  Implants: As above  Indications:  Visually significant cataract, Left Eye  Procedure:  The patient was seen and identified in the pre-operative area. The operative eye was identified and dilated.  The operative eye was marked.  Topical anesthesia was administered to the operative eye.     The patient was then to the operative suite and placed in the supine position.  A timeout was performed confirming the patient, procedure to be performed, and all other relevant information.   The patient's face was prepped and draped in the usual fashion for intra-ocular surgery.  A lid speculum was placed into the operative eye and the surgical microscope moved into place and focused.  A superotemporal paracentesis was created using a 20 gauge paracentesis blade.  Shugarcaine was injected into the anterior chamber.  Viscoelastic was injected into the anterior chamber.  A temporal clear-corneal main wound incision was created using a 2.46mmicrokeratome.  A continuous curvilinear capsulorrhexis was initiated using an irrigating cystitome and completed using capsulorrhexis forceps.  Hydrodissection and hydrodeliniation were performed.  Viscoelastic was injected into the anterior chamber.  A phacoemulsification handpiece and a chopper as a second instrument were used to remove the nucleus and epinucleus. The irrigation/aspiration handpiece was used to remove any remaining cortical material.   The capsular bag was reinflated with viscoelastic, checked,  and found to be intact.  The intraocular lens was inserted into the capsular bag and dialed into place using a Kuglen hook.  The irrigation/aspiration handpiece was used to remove any remaining viscoelastic.  The clear corneal wound and paracentesis wounds were then hydrated and checked with Weck-Cels to be watertight.  The lid-speculum and drape was removed, and the patient's face was cleaned with a wet and dry 4x4.  Maxitrol was instilled in the eye before a clear shield was taped over the eye. The patient was taken to the post-operative care unit in good condition, having tolerated the procedure well.  Post-Op Instructions: [facility] and will receive all other orders and instructions.

## 2016-12-24 ENCOUNTER — Encounter (HOSPITAL_COMMUNITY): Payer: Self-pay | Admitting: Ophthalmology

## 2017-01-10 DIAGNOSIS — H25811 Combined forms of age-related cataract, right eye: Secondary | ICD-10-CM | POA: Diagnosis not present

## 2017-01-14 ENCOUNTER — Encounter (HOSPITAL_COMMUNITY)
Admission: RE | Admit: 2017-01-14 | Discharge: 2017-01-14 | Disposition: A | Discharge status: Home / Self Care | Payer: Federal, State, Local not specified - PPO | Source: Ambulatory Visit | Attending: Ophthalmology | Admitting: Ophthalmology

## 2017-01-18 ENCOUNTER — Encounter (HOSPITAL_COMMUNITY): Payer: Self-pay | Admitting: Anesthesiology

## 2017-01-18 ENCOUNTER — Ambulatory Visit (HOSPITAL_COMMUNITY): Payer: Federal, State, Local not specified - PPO | Admitting: Anesthesiology

## 2017-01-18 ENCOUNTER — Encounter (HOSPITAL_COMMUNITY)
Admission: RE | Disposition: A | Discharge status: Home / Self Care | Payer: Self-pay | Source: Ambulatory Visit | Attending: Ophthalmology

## 2017-01-18 ENCOUNTER — Ambulatory Visit (HOSPITAL_COMMUNITY)
Admission: RE | Admit: 2017-01-18 | Discharge: 2017-01-18 | Disposition: A | Discharge status: Home / Self Care | Payer: Federal, State, Local not specified - PPO | Source: Ambulatory Visit | Attending: Ophthalmology | Admitting: Ophthalmology

## 2017-01-18 DIAGNOSIS — I10 Essential (primary) hypertension: Secondary | ICD-10-CM | POA: Insufficient documentation

## 2017-01-18 DIAGNOSIS — H2511 Age-related nuclear cataract, right eye: Secondary | ICD-10-CM | POA: Diagnosis not present

## 2017-01-18 DIAGNOSIS — Z79899 Other long term (current) drug therapy: Secondary | ICD-10-CM | POA: Diagnosis not present

## 2017-01-18 DIAGNOSIS — E1136 Type 2 diabetes mellitus with diabetic cataract: Secondary | ICD-10-CM | POA: Insufficient documentation

## 2017-01-18 DIAGNOSIS — H25811 Combined forms of age-related cataract, right eye: Secondary | ICD-10-CM | POA: Diagnosis not present

## 2017-01-18 HISTORY — PX: CATARACT EXTRACTION W/PHACO: SHX586

## 2017-01-18 SURGERY — PHACOEMULSIFICATION, CATARACT, WITH IOL INSERTION
Anesthesia: Monitor Anesthesia Care | Site: Eye | Laterality: Right

## 2017-01-18 MED ORDER — EPINEPHRINE PF 1 MG/ML IJ SOLN
INTRAOCULAR | Status: DC | PRN
  Administered 2017-01-18: 500 mL

## 2017-01-18 MED ORDER — MIDAZOLAM HCL 2 MG/2ML IJ SOLN
1.0000 mg | INTRAMUSCULAR | Status: AC
  Administered 2017-01-18: 2 mg via INTRAVENOUS

## 2017-01-18 MED ORDER — LIDOCAINE HCL (PF) 1 % IJ SOLN
INTRAOCULAR | Status: DC | PRN
  Administered 2017-01-18: 1 mL via OPHTHALMIC

## 2017-01-18 MED ORDER — POVIDONE-IODINE 5 % OP SOLN
OPHTHALMIC | Status: DC | PRN
  Administered 2017-01-18: 1 via OPHTHALMIC

## 2017-01-18 MED ORDER — NEOMYCIN-POLYMYXIN-DEXAMETH 3.5-10000-0.1 OP SUSP
OPHTHALMIC | Status: DC | PRN
  Administered 2017-01-18: 2 [drp] via OPHTHALMIC

## 2017-01-18 MED ORDER — PROVISC 10 MG/ML IO SOLN
INTRAOCULAR | Status: DC | PRN
  Administered 2017-01-18: 0.85 mL via INTRAOCULAR

## 2017-01-18 MED ORDER — MIDAZOLAM HCL 2 MG/2ML IJ SOLN
INTRAMUSCULAR | Status: AC
  Filled 2017-01-18: qty 2

## 2017-01-18 MED ORDER — CYCLOPENTOLATE-PHENYLEPHRINE 0.2-1 % OP SOLN
1.0000 [drp] | OPHTHALMIC | Status: AC
Start: 2017-01-18 — End: 2017-01-18
  Administered 2017-01-18 (×3): 1 [drp] via OPHTHALMIC

## 2017-01-18 MED ORDER — FENTANYL CITRATE (PF) 100 MCG/2ML IJ SOLN
25.0000 ug | Freq: Once | INTRAMUSCULAR | Status: AC
  Administered 2017-01-18: 25 ug via INTRAVENOUS

## 2017-01-18 MED ORDER — SODIUM HYALURONATE 23 MG/ML IO SOLN
INTRAOCULAR | Status: DC | PRN
  Administered 2017-01-18: 0.6 mL via INTRAOCULAR

## 2017-01-18 MED ORDER — PHENYLEPHRINE HCL 2.5 % OP SOLN
1.0000 [drp] | OPHTHALMIC | Status: AC
  Administered 2017-01-18 (×3): 1 [drp] via OPHTHALMIC

## 2017-01-18 MED ORDER — BSS IO SOLN
INTRAOCULAR | Status: DC | PRN
  Administered 2017-01-18 (×2): 15 mL via INTRAOCULAR

## 2017-01-18 MED ORDER — TETRACAINE HCL 0.5 % OP SOLN
1.0000 [drp] | OPHTHALMIC | Status: AC
  Administered 2017-01-18 (×3): 1 [drp] via OPHTHALMIC

## 2017-01-18 MED ORDER — LACTATED RINGERS IV SOLN
INTRAVENOUS | Status: DC
  Administered 2017-01-18: 10:00:00 via INTRAVENOUS

## 2017-01-18 MED ORDER — FENTANYL CITRATE (PF) 100 MCG/2ML IJ SOLN
INTRAMUSCULAR | Status: AC
  Filled 2017-01-18: qty 2

## 2017-01-18 MED ORDER — LIDOCAINE HCL 3.5 % OP GEL
1.0000 "application " | Freq: Once | OPHTHALMIC | Status: AC
  Administered 2017-01-18: 1 via OPHTHALMIC

## 2017-01-18 MED ORDER — EPINEPHRINE PF 1 MG/ML IJ SOLN
INTRAMUSCULAR | Status: AC
  Filled 2017-01-18: qty 1

## 2017-01-18 SURGICAL SUPPLY — 16 items
CLOTH BEACON ORANGE TIMEOUT ST (SAFETY) ×2 IMPLANT
EYE SHIELD UNIVERSAL CLEAR (GAUZE/BANDAGES/DRESSINGS) ×2 IMPLANT
GLOVE BIOGEL PI IND STRL 6.5 (GLOVE) ×1 IMPLANT
GLOVE BIOGEL PI IND STRL 7.0 (GLOVE) ×1 IMPLANT
GLOVE BIOGEL PI IND STRL 7.5 (GLOVE) IMPLANT
GLOVE BIOGEL PI INDICATOR 6.5 (GLOVE) ×1
GLOVE BIOGEL PI INDICATOR 7.0 (GLOVE) ×1
GLOVE BIOGEL PI INDICATOR 7.5 (GLOVE)
LENS ALC ACRYL/TECN (Ophthalmic Related) ×2 IMPLANT
NEEDLE HYPO 18GX1.5 BLUNT FILL (NEEDLE) ×2 IMPLANT
PAD ARMBOARD 7.5X6 YLW CONV (MISCELLANEOUS) ×2 IMPLANT
SYR TB 1ML LL NO SAFETY (SYRINGE) ×2 IMPLANT
TAPE SURG TRANSPORE 1 IN (GAUZE/BANDAGES/DRESSINGS) ×1 IMPLANT
TAPE SURGICAL TRANSPORE 1 IN (GAUZE/BANDAGES/DRESSINGS) ×1
VISCOELASTIC ADDITIONAL (OPHTHALMIC RELATED) ×2 IMPLANT
WATER STERILE IRR 250ML POUR (IV SOLUTION) ×2 IMPLANT

## 2017-01-18 NOTE — Anesthesia Preprocedure Evaluation (Signed)
Anesthesia Evaluation  Patient identified by MRN, date of birth, ID band Patient awake    Reviewed: Allergy & Precautions, NPO status , Patient's Chart, lab work & pertinent test results  Airway Mallampati: I   Neck ROM: Full    Dental  (+) Teeth Intact   Pulmonary neg pulmonary ROS,    breath sounds clear to auscultation       Cardiovascular hypertension, Pt. on medications  Rhythm:Regular Rate:Normal     Neuro/Psych negative neurological ROS  negative psych ROS   GI/Hepatic negative GI ROS,   Endo/Other  diabetes (pre DM)  Renal/GU      Musculoskeletal   Abdominal   Peds  Hematology   Anesthesia Other Findings   Reproductive/Obstetrics                             Anesthesia Physical Anesthesia Plan  ASA: II  Anesthesia Plan: MAC   Post-op Pain Management:    Induction: Intravenous  PONV Risk Score and Plan:   Airway Management Planned: Nasal Cannula  Additional Equipment:   Intra-op Plan:   Post-operative Plan:   Informed Consent: I have reviewed the patients History and Physical, chart, labs and discussed the procedure including the risks, benefits and alternatives for the proposed anesthesia with the patient or authorized representative who has indicated his/her understanding and acceptance.     Plan Discussed with:   Anesthesia Plan Comments:         Anesthesia Quick Evaluation

## 2017-01-18 NOTE — Discharge Instructions (Signed)
Please discharge patient when stable, will follow up today with Dr. Marisa Hua at the [facility] office immediately following the procedure.  Leave shield in place until visit.  All paperwork with discharge instructions will be given at the office.    PATIENT INSTRUCTIONS POST-ANESTHESIA  IMMEDIATELY FOLLOWING SURGERY:  Do not drive or operate machinery for the first twenty four hours after surgery.  Do not make any important decisions for twenty four hours after surgery or while taking narcotic pain medications or sedatives.  If you develop intractable nausea and vomiting or a severe headache please notify your doctor immediately.  FOLLOW-UP:  Please make an appointment with your surgeon as instructed. You do not need to follow up with anesthesia unless specifically instructed to do so.  WOUND CARE INSTRUCTIONS (if applicable):  Keep a dry clean dressing on the anesthesia/puncture wound site if there is drainage.  Once the wound has quit draining you may leave it open to air.  Generally you should leave the bandage intact for twenty four hours unless there is drainage.  If the epidural site drains for more than 36-48 hours please call the anesthesia department.  QUESTIONS?:  Please feel free to call your physician or the hospital operator if you have any questions, and they will be happy to assist you.

## 2017-01-18 NOTE — Op Note (Signed)
Date of procedure: 01/18/17  Pre-operative diagnosis: Visually significant cataract, Right Eye  Post-operative diagnosis: Visually significant cataract, Right Eye  Procedure: Removal of cataract via phacoemulsification and insertion of intra-ocular lens AMO PCB00  +22.5D into the capsular bag of the Right Eye  Attending surgeon: Gerda Diss. Kyo Cocuzza, MD, MA  Anesthesia: MAC, Topical Akten  Complications: None  Estimated Blood Loss: <47m (minimal)  Specimens: None  Implants: As above  Indications:  Visually significant cataract, Right Eye  Procedure:  The patient was seen and identified in the pre-operative area. The operative eye was identified and dilated.  The operative eye was marked.  Topical anesthesia was administered to the operative eye.     The patient was then to the operative suite and placed in the supine position.  A timeout was performed confirming the patient, procedure to be performed, and all other relevant information.   The patient's face was prepped and draped in the usual fashion for intra-ocular surgery.  A lid speculum was placed into the operative eye and the surgical microscope moved into place and focused.  A superotemporal paracentesis was created using a 20 gauge paracentesis blade.  Shugarcaine was injected into the anterior chamber.  Viscoelastic was injected into the anterior chamber.  A temporal clear-corneal main wound incision was created using a 2.463mmicrokeratome.  A continuous curvilinear capsulorrhexis was initiated using an irrigating cystitome and completed using capsulorrhexis forceps.  Hydrodissection and hydrodeliniation were performed.  Viscoelastic was injected into the anterior chamber.  A phacoemulsification handpiece and a chopper as a second instrument were used to remove the nucleus and epinucleus. The irrigation/aspiration handpiece was used to remove any remaining cortical material.   The capsular bag was reinflated with viscoelastic,  checked, and found to be intact.  The intraocular lens was inserted into the capsular bag and dialed into place using a Kuglen hook.  The irrigation/aspiration handpiece was used to remove any remaining viscoelastic.  The clear corneal wound and paracentesis wounds were then hydrated and checked with Weck-Cels to be watertight.  The lid-speculum and drape was removed, and the patient's face was cleaned with a wet and dry 4x4.  Maxitrol was instilled in the eye before a clear shield was taped over the eye. The patient was taken to the post-operative care unit in good condition, having tolerated the procedure well.  Post-Op Instructions: [facility] and will receive all other orders and instructions.

## 2017-01-18 NOTE — Transfer of Care (Signed)
Immediate Anesthesia Transfer of Care Note  Patient: Ricky Lowe  Procedure(s) Performed: CATARACT EXTRACTION PHACO AND INTRAOCULAR LENS PLACEMENT RIGHT EYE (Right Eye)  Patient Location: Short Stay  Anesthesia Type:MAC  Level of Consciousness: awake, alert , oriented and patient cooperative  Airway & Oxygen Therapy: Patient Spontanous Breathing  Post-op Assessment: Report given to RN and Post -op Vital signs reviewed and stable  Post vital signs: Reviewed and stable  Last Vitals:  Vitals:   01/18/17 0940 01/18/17 0945  BP: 118/87   Pulse:    Resp:    Temp:    SpO2: 100% 100%    Last Pain:  Vitals:   01/18/17 0930  TempSrc: Oral         Complications: No apparent anesthesia complications

## 2017-01-18 NOTE — Anesthesia Procedure Notes (Signed)
Procedure Name: MAC Date/Time: 01/18/2017 9:46 AM Performed by: Andree Elk, AMY A Pre-anesthesia Checklist: Patient identified, Timeout performed, Emergency Drugs available, Suction available and Patient being monitored Oxygen Delivery Method: Nasal cannula

## 2017-01-18 NOTE — H&P (Signed)
The H and P was reviewed and updated. The patient was examined.  No changes were found after exam.  The surgical eye was marked.  

## 2017-01-18 NOTE — Anesthesia Postprocedure Evaluation (Signed)
Anesthesia Post Note  Patient: Ricky Lowe  Procedure(s) Performed: CATARACT EXTRACTION PHACO AND INTRAOCULAR LENS PLACEMENT RIGHT EYE (Right Eye)  Patient location during evaluation: Short Stay Anesthesia Type: MAC Level of consciousness: awake and alert, oriented and patient cooperative Pain management: pain level controlled Vital Signs Assessment: post-procedure vital signs reviewed and stable Respiratory status: spontaneous breathing and respiratory function stable Cardiovascular status: stable Postop Assessment: no apparent nausea or vomiting Anesthetic complications: no     Last Vitals:  Vitals:   01/18/17 0940 01/18/17 0945  BP: 118/87   Pulse:    Resp:    Temp:    SpO2: 100% 100%    Last Pain:  Vitals:   01/18/17 0930  TempSrc: Oral                 ADAMS, AMY A

## 2017-01-21 ENCOUNTER — Encounter (HOSPITAL_COMMUNITY): Payer: Self-pay | Admitting: Ophthalmology

## 2017-04-12 ENCOUNTER — Other Ambulatory Visit: Payer: Self-pay

## 2017-04-12 ENCOUNTER — Ambulatory Visit: Payer: Federal, State, Local not specified - PPO | Admitting: Family Medicine

## 2017-04-12 ENCOUNTER — Encounter: Payer: Self-pay | Admitting: Family Medicine

## 2017-04-12 VITALS — BP 118/72 | HR 70 | Temp 97.8°F | Resp 14 | Ht 72.0 in | Wt 235.0 lb

## 2017-04-12 DIAGNOSIS — L821 Other seborrheic keratosis: Secondary | ICD-10-CM

## 2017-04-12 NOTE — Progress Notes (Signed)
Subjective:    Patient ID: Ricky Lowe, male    DOB: December 08, 1950, 66 y.o.   MRN: [id number]  HPI Patient's wife has several lesions on his back that she wants checked out.  One is at approximately the level of T12.  It is a diagonally oriented brown hyperkeratotic papule with a wartlike features consistent with a seborrheic keratosis.  It is approximately 1 cm in diameter.  There are numerous other lesions that are all approximately 4-8 mm in diameter that have similar features wide spread all across his back in no specific pattern.  Each are light brown wartlike papules with well-circumscribed borders and a waxy light coating when the area is scratched all consistent with seborrheic keratosis Past Medical History:  Diagnosis Date  . Arthritis   . Cataract   . Colon polyps    Dr. Sunny Schlein  . Dyslipidemia   . Hypertension   . Prediabetes    Past Surgical History:  Procedure Laterality Date  . CATARACT EXTRACTION W/PHACO Left 12/21/2016   Procedure: CATARACT EXTRACTION PHACO AND INTRAOCULAR LENS PLACEMENT (IOC);  Surgeon: Baruch Goldmann, MD;  Location: [facility];  Service: Ophthalmology;  Laterality: Left;  CDE: 2.63  . CATARACT EXTRACTION W/PHACO Right 01/18/2017   Procedure: CATARACT EXTRACTION PHACO AND INTRAOCULAR LENS PLACEMENT RIGHT EYE;  Surgeon: Baruch Goldmann, MD;  Location: [facility];  Service: Ophthalmology;  Laterality: Right;  CDE: 4.88  . JOINT REPLACEMENT  2010 / 2012   rt hip / lt hip   Current Outpatient Medications on File Prior to Visit  Medication Sig Dispense Refill  . aspirin 81 MG tablet Take 81 mg by mouth daily.    Marland Kitchen ibuprofen (ADVIL,MOTRIN) 200 MG tablet Take 400 mg by mouth every 8 (eight) hours as needed (for pain.).    Marland Kitchen lisinopril (PRINIVIL,ZESTRIL) 40 MG tablet TAKE 1 TABLET(40 MG) BY MOUTH DAILY 90 tablet 2  . Multiple Vitamins-Minerals (MULTIVITAMIN WITH MINERALS) tablet Take 1 tablet by mouth daily.    . NONFORMULARY OR COMPOUNDED ITEM 2-3 drops by Operative  site/used throughout procedure route See admin instructions. Compounded Eye Drop (Pred-Gati-Brom (Prednisolone Acetate/Gatifloxacin/Bromfenac)--Beginning 2 days before procedure use 3 drops daily--continue for 2 weeks after procedure, then decrease to 2 drop daily until bottle finished.    . sildenafil (VIAGRA) 100 MG tablet Take 1 tablet (100 mg total) by mouth daily as needed for erectile dysfunction. 10 tablet 2   No current facility-administered medications on file prior to visit.    No Known Allergies Social History   Socioeconomic History  . Marital status: Married    Spouse name: Not on file  . Number of children: Not on file  . Years of education: Not on file  . Highest education level: Not on file  Social Needs  . Financial resource strain: Not on file  . Food insecurity - worry: Not on file  . Food insecurity - inability: Not on file  . Transportation needs - medical: Not on file  . Transportation needs - non-medical: Not on file  Occupational History  . Not on file  Tobacco Use  . Smoking status: Never Smoker  . Smokeless tobacco: Former Network engineer and Sexual Activity  . Alcohol use: Yes    Comment: occ glass of wine every few months  . Drug use: No  . Sexual activity: Yes    Birth control/protection: None  Other Topics Concern  . Not on file  Social History Narrative  . Not on file  Review of Systems  All other systems reviewed and are negative.      Objective:   Physical Exam  Cardiovascular: Normal rate, regular rhythm and normal heart sounds.  Pulmonary/Chest: Effort normal and breath sounds normal.  Vitals reviewed.  see hpi        Assessment & Plan:  Seborrheic keratoses  Lesions on the patient's back are all consistent with seborrheic keratosis.  There are no lesions concerning for melanoma or atypical moles.  I have offered the patient a biopsy to be 096% certain however I feel certain visually simply reassuring the patient that no  treatment is needed.  They are comfortable just watching the lesions for any change and can return at any time for biopsy if they are concerned

## 2017-05-30 ENCOUNTER — Other Ambulatory Visit: Payer: Self-pay | Admitting: Family Medicine

## 2017-05-30 DIAGNOSIS — I10 Essential (primary) hypertension: Secondary | ICD-10-CM

## 2017-06-17 ENCOUNTER — Other Ambulatory Visit: Payer: Self-pay | Admitting: Family Medicine

## 2017-06-17 MED ORDER — OSELTAMIVIR PHOSPHATE 75 MG PO CAPS: 75.0000 mg | ORAL_CAPSULE | Freq: Every day | ORAL | 0 refills | Status: DC

## 2017-06-17 NOTE — Progress Notes (Signed)
Pt wife here positive for flu He had some fever last week, but feels well Will Rx Tamiflu prophylactic

## 2017-08-08 ENCOUNTER — Other Ambulatory Visit: Payer: Self-pay | Admitting: Family Medicine

## 2017-08-23 ENCOUNTER — Ambulatory Visit: Payer: Federal, State, Local not specified - PPO | Admitting: Family Medicine

## 2017-08-23 ENCOUNTER — Encounter: Payer: Self-pay | Admitting: Family Medicine

## 2017-08-23 VITALS — BP 150/72 | HR 78 | Temp 97.9°F | Resp 16 | Ht 72.0 in | Wt 236.0 lb

## 2017-08-23 DIAGNOSIS — T63421A Toxic effect of venom of ants, accidental (unintentional), initial encounter: Secondary | ICD-10-CM

## 2017-08-23 MED ORDER — PREDNISONE 20 MG PO TABS: ORAL_TABLET | ORAL | 0 refills | Status: DC

## 2017-08-23 NOTE — Progress Notes (Signed)
Subjective:    Patient ID: Ricky Lowe, male    DOB: 02-03-1951, 67 y.o.   MRN: [id number]  HPI Patient presents with a diffuse rash all over his lower extremities.  It is particularly worse around his ankles particularly the medial malleoli.  Rash consists of erythematous papules that are 1 to 2 mm in diameter that are clumped together.  There are also several fluid-filled vesicles that are 2 to 3 mm in diameter with an erythematous base.  They are too numerous to count.  There are 100s on each leg.  They stopped just above the knee.  They itch severely.  He is tried cortisone cream with very little improvement.  Patient states that he got the rash immediately after walking through field assessing crop damage that was covered in fire ant holes.  He believes that the bugs crawled up his boot leg and got down into his boots and then bit him and stung him.  The other possibility would be a severe contact dermatitis to some type of plant as he was walking through the fields. Past Medical History:  Diagnosis Date  . Arthritis   . Cataract   . Colon polyps    Dr. Sunny Schlein  . Dyslipidemia   . Hypertension   . Prediabetes    Past Surgical History:  Procedure Laterality Date  . CATARACT EXTRACTION W/PHACO Left 12/21/2016   Procedure: CATARACT EXTRACTION PHACO AND INTRAOCULAR LENS PLACEMENT (IOC);  Surgeon: Baruch Goldmann, MD;  Location: [facility];  Service: Ophthalmology;  Laterality: Left;  CDE: 2.63  . CATARACT EXTRACTION W/PHACO Right 01/18/2017   Procedure: CATARACT EXTRACTION PHACO AND INTRAOCULAR LENS PLACEMENT RIGHT EYE;  Surgeon: Baruch Goldmann, MD;  Location: [facility];  Service: Ophthalmology;  Laterality: Right;  CDE: 4.88  . JOINT REPLACEMENT  2010 / 2012   rt hip / lt hip   Current Outpatient Medications on File Prior to Visit  Medication Sig Dispense Refill  . aspirin 81 MG tablet Take 81 mg by mouth daily.    Marland Kitchen ibuprofen (ADVIL,MOTRIN) 200 MG tablet Take 400 mg by mouth every 8 (eight)  hours as needed (for pain.).    Marland Kitchen lisinopril (PRINIVIL,ZESTRIL) 40 MG tablet TAKE 1 TABLET(40 MG) BY MOUTH DAILY 90 tablet 0  . Multiple Vitamins-Minerals (MULTIVITAMIN WITH MINERALS) tablet Take 1 tablet by mouth daily.    . NONFORMULARY OR COMPOUNDED ITEM 2-3 drops by Operative site/used throughout procedure route See admin instructions. Compounded Eye Drop (Pred-Gati-Brom (Prednisolone Acetate/Gatifloxacin/Bromfenac)--Beginning 2 days before procedure use 3 drops daily--continue for 2 weeks after procedure, then decrease to 2 drop daily until bottle finished.    . sildenafil (VIAGRA) 100 MG tablet Take 1 tablet (100 mg total) by mouth daily as needed for erectile dysfunction. 10 tablet 2   No current facility-administered medications on file prior to visit.    No Known Allergies Social History   Socioeconomic History  . Marital status: Married    Spouse name: Not on file  . Number of children: Not on file  . Years of education: Not on file  . Highest education level: Not on file  Occupational History  . Not on file  Social Needs  . Financial resource strain: Not on file  . Food insecurity:    Worry: Not on file    Inability: Not on file  . Transportation needs:    Medical: Not on file    Non-medical: Not on file  Tobacco Use  . Smoking status: Never Smoker  .  Smokeless tobacco: Former Network engineer and Sexual Activity  . Alcohol use: Yes    Comment: occ glass of wine every few months  . Drug use: No  . Sexual activity: Yes    Birth control/protection: None  Lifestyle  . Physical activity:    Days per week: Not on file    Minutes per session: Not on file  . Stress: Not on file  Relationships  . Social connections:    Talks on phone: Not on file    Gets together: Not on file    Attends religious service: Not on file    Active member of club or organization: Not on file    Attends meetings of clubs or organizations: Not on file    Relationship status: Not on file  .  Intimate partner violence:    Fear of current or ex partner: Not on file    Emotionally abused: Not on file    Physically abused: Not on file    Forced sexual activity: Not on file  Other Topics Concern  . Not on file  Social History Narrative  . Not on file      Review of Systems  All other systems reviewed and are negative.      Objective:   Physical Exam  Cardiovascular: Normal rate, regular rhythm and normal heart sounds.  Pulmonary/Chest: Effort normal and breath sounds normal. No stridor. No respiratory distress. He has no wheezes. He has no rales.  Skin: Rash noted. There is erythema.     Vitals reviewed.         Assessment & Plan:  Fire ant bite, accidental or unintentional, initial encounter  Try prednisone taper pack in addition to calamine lotion.  Reassess next week if no better or sooner if worsening

## 2017-08-30 ENCOUNTER — Other Ambulatory Visit: Payer: Self-pay | Admitting: Family Medicine

## 2017-08-30 DIAGNOSIS — I10 Essential (primary) hypertension: Secondary | ICD-10-CM

## 2017-12-03 ENCOUNTER — Encounter (INDEPENDENT_AMBULATORY_CARE_PROVIDER_SITE_OTHER): Payer: Federal, State, Local not specified - PPO | Admitting: Ophthalmology

## 2017-12-03 DIAGNOSIS — D3131 Benign neoplasm of right choroid: Secondary | ICD-10-CM | POA: Diagnosis not present

## 2017-12-03 DIAGNOSIS — H43813 Vitreous degeneration, bilateral: Secondary | ICD-10-CM | POA: Diagnosis not present

## 2017-12-03 DIAGNOSIS — H35033 Hypertensive retinopathy, bilateral: Secondary | ICD-10-CM | POA: Diagnosis not present

## 2017-12-03 DIAGNOSIS — I10 Essential (primary) hypertension: Secondary | ICD-10-CM | POA: Diagnosis not present

## 2018-09-11 ENCOUNTER — Other Ambulatory Visit: Payer: Self-pay | Admitting: Family Medicine

## 2018-09-11 DIAGNOSIS — I10 Essential (primary) hypertension: Secondary | ICD-10-CM

## 2018-12-04 ENCOUNTER — Encounter (INDEPENDENT_AMBULATORY_CARE_PROVIDER_SITE_OTHER): Payer: Medicare Other | Admitting: Ophthalmology

## 2018-12-04 ENCOUNTER — Other Ambulatory Visit: Payer: Self-pay

## 2018-12-04 DIAGNOSIS — D3131 Benign neoplasm of right choroid: Secondary | ICD-10-CM | POA: Diagnosis not present

## 2018-12-04 DIAGNOSIS — I10 Essential (primary) hypertension: Secondary | ICD-10-CM

## 2018-12-04 DIAGNOSIS — H43813 Vitreous degeneration, bilateral: Secondary | ICD-10-CM | POA: Diagnosis not present

## 2018-12-04 DIAGNOSIS — H35033 Hypertensive retinopathy, bilateral: Secondary | ICD-10-CM

## 2018-12-20 ENCOUNTER — Other Ambulatory Visit: Payer: Self-pay | Admitting: Family Medicine

## 2018-12-20 DIAGNOSIS — I10 Essential (primary) hypertension: Secondary | ICD-10-CM

## 2018-12-23 ENCOUNTER — Other Ambulatory Visit: Payer: Self-pay

## 2018-12-23 ENCOUNTER — Other Ambulatory Visit: Payer: Medicare Other

## 2018-12-23 DIAGNOSIS — R7303 Prediabetes: Secondary | ICD-10-CM

## 2018-12-23 DIAGNOSIS — R590 Localized enlarged lymph nodes: Secondary | ICD-10-CM

## 2018-12-23 DIAGNOSIS — Z0000 Encounter for general adult medical examination without abnormal findings: Secondary | ICD-10-CM

## 2018-12-23 DIAGNOSIS — I10 Essential (primary) hypertension: Secondary | ICD-10-CM

## 2018-12-23 DIAGNOSIS — E7800 Pure hypercholesterolemia, unspecified: Secondary | ICD-10-CM

## 2018-12-25 ENCOUNTER — Encounter: Payer: Self-pay | Admitting: Family Medicine

## 2018-12-25 ENCOUNTER — Ambulatory Visit (INDEPENDENT_AMBULATORY_CARE_PROVIDER_SITE_OTHER): Payer: Medicare Other | Admitting: Family Medicine

## 2018-12-25 ENCOUNTER — Other Ambulatory Visit: Payer: Self-pay

## 2018-12-25 VITALS — BP 130/70 | HR 68 | Temp 97.9°F | Resp 14 | Ht 72.0 in | Wt 237.0 lb

## 2018-12-25 DIAGNOSIS — I10 Essential (primary) hypertension: Secondary | ICD-10-CM

## 2018-12-25 DIAGNOSIS — K635 Polyp of colon: Secondary | ICD-10-CM

## 2018-12-25 DIAGNOSIS — E7800 Pure hypercholesterolemia, unspecified: Secondary | ICD-10-CM

## 2018-12-25 DIAGNOSIS — R7303 Prediabetes: Secondary | ICD-10-CM

## 2018-12-25 DIAGNOSIS — Z23 Encounter for immunization: Secondary | ICD-10-CM

## 2018-12-25 DIAGNOSIS — Z0000 Encounter for general adult medical examination without abnormal findings: Secondary | ICD-10-CM | POA: Diagnosis not present

## 2018-12-25 MED ORDER — LISINOPRIL 40 MG PO TABS: ORAL_TABLET | ORAL | 3 refills | Status: DC

## 2018-12-25 MED ORDER — ROSUVASTATIN CALCIUM 5 MG PO TABS: 5.0000 mg | ORAL_TABLET | Freq: Every day | ORAL | 3 refills | Status: DC

## 2018-12-25 MED ORDER — SILDENAFIL CITRATE 100 MG PO TABS: 100.0000 mg | ORAL_TABLET | Freq: Every day | ORAL | 5 refills | Status: DC | PRN

## 2018-12-25 NOTE — Addendum Note (Signed)
Addended by: Shary Decamp B on: 12/25/2018 04:40 PM   Modules accepted: Orders

## 2018-12-25 NOTE — Progress Notes (Signed)
Subjective:    Patient ID: Ricky Lowe, male    DOB: 02/07/1951, 68 y.o.   MRN: [id number]  HPI Patient is here today for CPE.  Has a past medical history of hypertension.  Lab on 12/23/2018  Component Date Value Ref Range Status  . WBC 12/23/2018 5.0  3.8 - 10.8 Thousand/uL Final  . RBC 12/23/2018 5.10  4.20 - 5.80 Million/uL Final  . Hemoglobin 12/23/2018 15.2  13.2 - 17.1 g/dL Final  . HCT 12/23/2018 45.3  38.5 - 50.0 % Final  . MCV 12/23/2018 88.8  80.0 - 100.0 fL Final  . MCH 12/23/2018 29.8  27.0 - 33.0 pg Final  . MCHC 12/23/2018 33.6  32.0 - 36.0 g/dL Final  . RDW 12/23/2018 12.5  11.0 - 15.0 % Final  . Platelets 12/23/2018 178  140 - 400 Thousand/uL Final  . MPV 12/23/2018 10.9  7.5 - 12.5 fL Final  . Neutro Abs 12/23/2018 2,010  1,500 - 7,800 cells/uL Final  . Lymphs Abs 12/23/2018 2,270  850 - 3,900 cells/uL Final  . Absolute Monocytes 12/23/2018 530  200 - 950 cells/uL Final  . Eosinophils Absolute 12/23/2018 150  15 - 500 cells/uL Final  . Basophils Absolute 12/23/2018 40  0 - 200 cells/uL Final  . Neutrophils Relative % 12/23/2018 40.2  % Final  . Total Lymphocyte 12/23/2018 45.4  % Final  . Monocytes Relative 12/23/2018 10.6  % Final  . Eosinophils Relative 12/23/2018 3.0  % Final  . Basophils Relative 12/23/2018 0.8  % Final  . Hgb A1c MFr Bld 12/23/2018 6.3* <5.7 % of total Hgb Final   Comment: For someone without known diabetes, a hemoglobin  A1c value between 5.7% and 6.4% is consistent with prediabetes and should be confirmed with a  follow-up test. . For someone with known diabetes, a value <7% indicates that their diabetes is well controlled. A1c targets should be individualized based on duration of diabetes, age, comorbid conditions, and other considerations. . This assay result is consistent with an increased risk of diabetes. . Currently, no consensus exists regarding use of hemoglobin A1c for diagnosis of diabetes for children. .   . Mean  Plasma Glucose 12/23/2018 134  (calc) Final  . eAG (mmol/L) 12/23/2018 7.4  (calc) Final  . Glucose, Bld 12/23/2018 137* 65 - 99 mg/dL Final   Comment: .            Fasting reference interval . For someone without known diabetes, a glucose value >125 mg/dL indicates that they may have diabetes and this should be confirmed with a follow-up test. .   . BUN 12/23/2018 17  7 - 25 mg/dL Final  . Creat 12/23/2018 1.12  0.70 - 1.25 mg/dL Final   Comment: For patients >57 years of age, the reference limit for Creatinine is approximately 13% higher for people identified as African-American. .   . GFR, Est Non African American 12/23/2018 69  > OR = 60 mL/min/1.68m2 Final  . GFR, Est African American 12/23/2018 79  > OR = 60 mL/min/1.84m2 Final  . BUN/Creatinine Ratio [id number] NOT APPLICABLE  6 - 22 (calc) Final  . Sodium 12/23/2018 138  135 - 146 mmol/L Final  . Potassium 12/23/2018 4.5  3.5 - 5.3 mmol/L Final  . Chloride 12/23/2018 104  98 - 110 mmol/L Final  . CO2 12/23/2018 25  20 - 32 mmol/L Final  . Calcium 12/23/2018 9.6  8.6 - 10.3 mg/dL Final  . Total Protein 12/23/2018 6.5  6.1 - 8.1 g/dL Final  . Albumin 12/23/2018 4.4  3.6 - 5.1 g/dL Final  . Globulin 12/23/2018 2.1  1.9 - 3.7 g/dL (calc) Final  . AG Ratio 12/23/2018 2.1  1.0 - 2.5 (calc) Final  . Total Bilirubin 12/23/2018 0.5  0.2 - 1.2 mg/dL Final  . Alkaline phosphatase (APISO) 12/23/2018 36  35 - 144 U/L Final  . AST 12/23/2018 28  10 - 35 U/L Final  . ALT 12/23/2018 36  9 - 46 U/L Final  . Cholesterol 12/23/2018 211* <200 mg/dL Final  . HDL 12/23/2018 38* > OR = 40 mg/dL Final  . Triglycerides 12/23/2018 136  <150 mg/dL Final  . LDL Cholesterol (Calc) 12/23/2018 147* mg/dL (calc) Final   Comment: Reference range: <100 . Desirable range <100 mg/dL for primary prevention;   <70 mg/dL for patients with CHD or diabetic patients  with > or = 2 CHD risk factors. Marland Kitchen LDL-C is now calculated using the Martin-Hopkins   calculation, which is a validated novel method providing  better accuracy than the Friedewald equation in the  estimation of LDL-C.  Cresenciano Genre et al. Annamaria Helling. [id number]): 2061-2068  (http://education.[facility].com/faq/FAQ164)   . Total CHOL/HDL Ratio 12/23/2018 5.6* <5.0 (calc) Final  . Non-HDL Cholesterol (Calc) 12/23/2018 173* <130 mg/dL (calc) Final   Comment: For patients with diabetes plus 1 major ASCVD risk  factor, treating to a non-HDL-C goal of <100 mg/dL  (LDL-C of <70 mg/dL) is considered a therapeutic  option.    Patient's last colonoscopy was in 2014 in another city.  He has a history of an adenomatous polyp and was recommended to have a repeat colonoscopy in 5 years.  This is overdue.  He is also due for a PSA to screen for prostate cancer.  Patient's most recent lab work is listed above.  There has been a significant rise in his blood sugar and his hemoglobin A1c is now borderline diabetic with a hemoglobin A1c of 6.3.  His total cholesterol was greater than 200 and his LDL cholesterol is greater than 100.  His HDL cholesterol is less than 40.  This coupled with his age and his blood pressure increases his cardiovascular risk substantially.  He is not taking a statin.  He is due today for a flu shot along with Prevnar 13. Past Medical History:  Diagnosis Date  . Arthritis   . Cataract   . Colon polyps    Dr. Sunny Schlein  . Dyslipidemia   . Hypertension   . Prediabetes    Past Surgical History:  Procedure Laterality Date  . CATARACT EXTRACTION W/PHACO Left 12/21/2016   Procedure: CATARACT EXTRACTION PHACO AND INTRAOCULAR LENS PLACEMENT (IOC);  Surgeon: Baruch Goldmann, MD;  Location: [facility];  Service: Ophthalmology;  Laterality: Left;  CDE: 2.63  . CATARACT EXTRACTION W/PHACO Right 01/18/2017   Procedure: CATARACT EXTRACTION PHACO AND INTRAOCULAR LENS PLACEMENT RIGHT EYE;  Surgeon: Baruch Goldmann, MD;  Location: [facility];  Service: Ophthalmology;  Laterality: Right;  CDE: 4.88   . JOINT REPLACEMENT  2010 / 2012   rt hip / lt hip   Current Outpatient Medications on File Prior to Visit  Medication Sig Dispense Refill  . aspirin 81 MG tablet Take 81 mg by mouth daily.    Marland Kitchen ibuprofen (ADVIL,MOTRIN) 200 MG tablet Take 400 mg by mouth every 8 (eight) hours as needed (for pain.).    Marland Kitchen lisinopril (ZESTRIL) 40 MG tablet TAKE 1 TABLET(40 MG) BY MOUTH DAILY 90 tablet 0  .  Multiple Vitamins-Minerals (MULTIVITAMIN WITH MINERALS) tablet Take 1 tablet by mouth daily.    . NONFORMULARY OR COMPOUNDED ITEM 2-3 drops by Operative site/used throughout procedure route See admin instructions. Compounded Eye Drop (Pred-Gati-Brom (Prednisolone Acetate/Gatifloxacin/Bromfenac)--Beginning 2 days before procedure use 3 drops daily--continue for 2 weeks after procedure, then decrease to 2 drop daily until bottle finished.    . predniSONE (DELTASONE) 20 MG tablet 3 tabs poqday 1-2, 2 tabs poqday 3-4, 1 tab poqday 5-6 12 tablet 0  . sildenafil (VIAGRA) 100 MG tablet Take 1 tablet (100 mg total) by mouth daily as needed for erectile dysfunction. 10 tablet 2   No current facility-administered medications on file prior to visit.    No Known Allergies Social History   Socioeconomic History  . Marital status: Married    Spouse name: Not on file  . Number of children: Not on file  . Years of education: Not on file  . Highest education level: Not on file  Occupational History  . Not on file  Social Needs  . Financial resource strain: Not on file  . Food insecurity    Worry: Not on file    Inability: Not on file  . Transportation needs    Medical: Not on file    Non-medical: Not on file  Tobacco Use  . Smoking status: Never Smoker  . Smokeless tobacco: Former Network engineer and Sexual Activity  . Alcohol use: Yes    Comment: occ glass of wine every few months  . Drug use: No  . Sexual activity: Yes    Birth control/protection: None  Lifestyle  . Physical activity    Days per week:  Not on file    Minutes per session: Not on file  . Stress: Not on file  Relationships  . Social Herbalist on phone: Not on file    Gets together: Not on file    Attends religious service: Not on file    Active member of club or organization: Not on file    Attends meetings of clubs or organizations: Not on file    Relationship status: Not on file  . Intimate partner violence    Fear of current or ex partner: Not on file    Emotionally abused: Not on file    Physically abused: Not on file    Forced sexual activity: Not on file  Other Topics Concern  . Not on file  Social History Narrative  . Not on file   Family History  Problem Relation Age of Onset  . Diabetes Mother   . Hearing loss Mother   . Heart disease Mother   . Hyperlipidemia Mother   . Stroke Mother   . Vision loss Mother   . Hypertension Father   . Arthritis Maternal Grandmother       Review of Systems  All other systems reviewed and are negative.      Objective:   Physical Exam  Constitutional: He is oriented to person, place, and time. He appears well-developed and well-nourished. No distress.  HENT:  Head: Normocephalic and atraumatic.  Right Ear: External ear normal.  Left Ear: External ear normal.  Nose: Nose normal.  Mouth/Throat: Oropharynx is clear and moist. No oropharyngeal exudate.  Eyes: Pupils are equal, round, and reactive to light. Conjunctivae and EOM are normal. Right eye exhibits no discharge. Left eye exhibits no discharge. No scleral icterus.  Neck: Normal range of motion. Neck supple. No JVD present. No tracheal  deviation present. No thyromegaly present.  Cardiovascular: Normal rate, regular rhythm, normal heart sounds and intact distal pulses. Exam reveals no gallop and no friction rub.  No murmur heard. Pulmonary/Chest: Effort normal and breath sounds normal. No stridor. No respiratory distress. He has no wheezes. He has no rales. He exhibits no tenderness.  Abdominal:  Soft. Bowel sounds are normal. He exhibits no distension and no mass. There is no abdominal tenderness. There is no rebound and no guarding.  Genitourinary:    Prostate, penis and rectum normal.   Musculoskeletal: Normal range of motion.        General: No tenderness or edema.  Lymphadenopathy:    He has no cervical adenopathy.  Neurological: He is alert and oriented to person, place, and time. He has normal reflexes. No cranial nerve deficit. He exhibits normal muscle tone. Coordination normal.  Skin: Skin is warm. No rash noted. He is not diaphoretic. No erythema. No pallor.  Psychiatric: He has a normal mood and affect. His behavior is normal. Judgment and thought content normal.  Vitals reviewed.         Assessment & Plan:  Polyp of colon, unspecified part of colon, unspecified type - Plan: Ambulatory referral to Gastroenterology  Benign essential HTN - Plan: lisinopril (ZESTRIL) 40 MG tablet  Pure hypercholesterolemia  Prediabetes  General medical exam  Spent more than 20 minutes today with the patient simply discussing prediabetes and a low carbohydrate diet.  Recommended significant lifestyle changes including 30 minutes a day 5 days a week of aerobic exercise, 15 to 20 pounds weight loss, and rechecking a hemoglobin A1c in 6 months.  Given the fact he is almost a diabetic, his HDL cholesterol is less than 40, and his LDL cholesterol is elevated coupled with his age and blood pressure I have recommended starting Crestor 5 mg a day.  Repeat fasting lipid panel in 6 months.  Patient received his flu shot along with Prevnar 13 today.  He will receive Pneumovax 23 next year.  I will schedule the patient for a colonoscopy.  Check a PSA to screen for prostate cancer.  The patient denies any issues with depression, falls, or memory loss.

## 2018-12-26 LAB — HEMOGLOBIN A1C
Hgb A1c MFr Bld: 6.3 % of total Hgb — ABNORMAL HIGH (ref <5.7)
Mean Plasma Glucose: 134 (calc)
eAG (mmol/L): 7.4 (calc)

## 2018-12-26 LAB — CBC WITH DIFFERENTIAL/PLATELET
Absolute Monocytes: 530 cells/uL (ref 200–950)
Basophils Absolute: 40 cells/uL (ref 0–200)
Basophils Relative: 0.8 %
Eosinophils Absolute: 150 cells/uL (ref 15–500)
Eosinophils Relative: 3 %
HCT: 45.3 % (ref 38.5–50.0)
Hemoglobin: 15.2 g/dL (ref 13.2–17.1)
Lymphs Abs: 2270 cells/uL (ref 850–3900)
MCH: 29.8 pg (ref 27.0–33.0)
MCHC: 33.6 g/dL (ref 32.0–36.0)
MCV: 88.8 fL (ref 80.0–100.0)
MPV: 10.9 fL (ref 7.5–12.5)
Monocytes Relative: 10.6 %
Neutro Abs: 2010 cells/uL (ref 1500–7800)
Neutrophils Relative %: 40.2 %
Platelets: 178 10*3/uL (ref 140–400)
RBC: 5.1 10*6/uL (ref 4.20–5.80)
RDW: 12.5 % (ref 11.0–15.0)
Total Lymphocyte: 45.4 %
WBC: 5 10*3/uL (ref 3.8–10.8)

## 2018-12-26 LAB — LIPID PANEL
Cholesterol: 211 mg/dL — ABNORMAL HIGH (ref <200)
HDL: 38 mg/dL — ABNORMAL LOW (ref >=40)
LDL Cholesterol (Calc): 147 mg/dL (calc) — ABNORMAL HIGH
Non-HDL Cholesterol (Calc): 173 mg/dL (calc) — ABNORMAL HIGH (ref <130)
Total CHOL/HDL Ratio: 5.6 (calc) — ABNORMAL HIGH (ref <5.0)
Triglycerides: 136 mg/dL (ref <150)

## 2018-12-26 LAB — COMPLETE METABOLIC PANEL WITHOUT GFR
AG Ratio: 2.1 (calc) (ref 1.0–2.5)
ALT: 36 U/L (ref 9–46)
AST: 28 U/L (ref 10–35)
Albumin: 4.4 g/dL (ref 3.6–5.1)
Alkaline phosphatase (APISO): 36 U/L (ref 35–144)
BUN: 17 mg/dL (ref 7–25)
CO2: 25 mmol/L (ref 20–32)
Calcium: 9.6 mg/dL (ref 8.6–10.3)
Chloride: 104 mmol/L (ref 98–110)
Creat: 1.12 mg/dL (ref 0.70–1.25)
GFR, Est African American: 79 mL/min/1.73m2
GFR, Est Non African American: 69 mL/min/1.73m2
Globulin: 2.1 g/dL (ref 1.9–3.7)
Glucose, Bld: 137 mg/dL — ABNORMAL HIGH (ref 65–99)
Potassium: 4.5 mmol/L (ref 3.5–5.3)
Sodium: 138 mmol/L (ref 135–146)
Total Bilirubin: 0.5 mg/dL (ref 0.2–1.2)
Total Protein: 6.5 g/dL (ref 6.1–8.1)

## 2018-12-26 LAB — TEST AUTHORIZATION

## 2018-12-26 LAB — PSA: PSA: 0.4 ng/mL

## 2018-12-30 ENCOUNTER — Encounter: Payer: Self-pay | Admitting: Internal Medicine

## 2019-02-11 ENCOUNTER — Ambulatory Visit (INDEPENDENT_AMBULATORY_CARE_PROVIDER_SITE_OTHER): Payer: Medicare Other | Admitting: *Deleted

## 2019-02-11 ENCOUNTER — Other Ambulatory Visit: Payer: Self-pay

## 2019-02-11 DIAGNOSIS — Z86010 Personal history of colonic polyps: Secondary | ICD-10-CM

## 2019-02-11 MED ORDER — PEG 3350-KCL-NA BICARB-NACL 420 G PO SOLR: 4000.0000 mL | Freq: Once | ORAL | 0 refills | Status: AC

## 2019-02-11 NOTE — Patient Instructions (Signed)
Ricky Lowe   February 10, 1951 MRN: [id number]    Procedure Date: 05/06/2019 Time to register: 9:30 am Place to register: [facility] Procedure Time: 10:30 am Scheduled provider: Dr. Gala Romney  PREPARATION FOR COLONOSCOPY WITH TRI-LYTE SPLIT PREP  Please notify us immediately if you are diabetic, take iron supplements, or if you are on Coumadin or any other blood thinners.   You will need to purchase 1 fleet enema and 1 box of Bisacodyl 58m tablets.   2 DAYS BEFORE PROCEDURE:  DATE: 05/04/2019  DAY: Monday Begin clear liquid diet AFTER your lunch meal. NO SOLID FOODS after this point.  1 DAY BEFORE PROCEDURE:  DATE: 05/05/2019   DAY: Tuesday Continue clear liquids the entire day - NO SOLID FOOD.   At 2:00 pm:  Take 2 Bisacodyl tablets.   At 4:00pm:  Start drinking your solution. Make sure you mix well per instructions on the bottle. Try to drink 1 (one) 8 ounce glass every 10-15 minutes until you have consumed HALF the jug. You should complete by 6:00pm.You must keep the left over solution refrigerated until completed next day.  Continue clear liquids. You must drink plenty of clear liquids to prevent dehyration and kidney failure.     DAY OF PROCEDURE:   DATE: 05/06/2019   DAY: Wednesday If you take medications for your heart, blood pressure or breathing, you may take these medications.   Five hours before your procedure time @  5:30 am:  Finish remaining amout of bowel prep, drinking 1 (one) 8 ounce glass every 10-15 minutes until complete. You have two hours to consume remaining prep.   Three hours before your procedure time @ 7:30 am:  Nothing by mouth.   At least one hour before going to the hospital:  Give yourself one Fleet enema. You may take your morning medications with sip of water unless we have instructed otherwise.      Please see below for Dietary Information.  CLEAR LIQUIDS INCLUDE:  Water Jello (NOT red in color)   Ice Popsicles (NOT red in color)   Tea  (sugar ok, no milk/cream) Powdered fruit flavored drinks  Coffee (sugar ok, no milk/cream) Gatorade/ Lemonade/ Kool-Aid  (NOT red in color)   Juice: apple, white grape, white cranberry Soft drinks  Clear bullion, consomme, broth (fat free beef/chicken/vegetable)  Carbonated beverages (any kind)  Strained chicken noodle soup Hard Candy   Remember: Clear liquids are liquids that will allow you to see your fingers on the other side of a clear glass. Be sure liquids are NOT red in color, and not cloudy, but CLEAR.  DO NOT EAT OR DRINK ANY OF THE FOLLOWING:  Dairy products of any kind   Cranberry juice Tomato juice / V8 juice   Grapefruit juice Orange juice     Red grape juice  Do not eat any solid foods, including such foods as: cereal, oatmeal, yogurt, fruits, vegetables, creamed soups, eggs, bread, crackers, pureed foods in a blender, etc.   HELPFUL HINTS FOR DRINKING PREP SOLUTION:   Make sure prep is extremely cold. Mix and refrigerate the the morning of the prep. You may also put in the freezer.   You may try mixing some Crystal Light or Country Time Lemonade if you prefer. Mix in small amounts; add more if necessary.  Try drinking through a straw  Rinse mouth with water or a mouthwash between glasses, to remove after-taste.  Try sipping on a cold beverage /ice/ popsicles between glasses of prep.  Place a piece of sugar-free hard candy in mouth between glasses.  If you become nauseated, try consuming smaller amounts, or stretch out the time between glasses. Stop for 30-60 minutes, then slowly start back drinking.        OTHER INSTRUCTIONS  You will need a responsible adult at least 66 years of age to accompany you and drive you home. This person must remain in the waiting room during your procedure. The hospital will cancel your procedure if you do not have a responsible adult with you.   1. Wear loose fitting clothing that is easily removed. 2. Leave jewelry and other  valuables at home.  3. Remove all body piercing jewelry and leave at home. 4. Total time from sign-in until discharge is approximately 2-3 hours. 5. You should go home directly after your procedure and rest. You can resume normal activities the day after your procedure. 6. The day of your procedure you should not:  Drive  Make legal decisions  Operate machinery  Drink alcohol  Return to work   You may call the office (Dept: [phone]) before 5:00pm, or page the doctor on call [phone]) after 5:00pm, for further instructions, if necessary.   Insurance Information YOU WILL NEED TO CHECK WITH YOUR INSURANCE COMPANY FOR THE BENEFITS OF COVERAGE YOU HAVE FOR THIS PROCEDURE.  UNFORTUNATELY, NOT ALL INSURANCE COMPANIES HAVE BENEFITS TO COVER ALL OR PART OF THESE TYPES OF PROCEDURES.  IT IS YOUR RESPONSIBILITY TO CHECK YOUR BENEFITS, HOWEVER, WE WILL BE GLAD TO ASSIST YOU WITH ANY CODES YOUR INSURANCE COMPANY MAY NEED.    PLEASE NOTE THAT MOST INSURANCE COMPANIES WILL NOT COVER A SCREENING COLONOSCOPY FOR PEOPLE UNDER THE AGE OF 50  IF YOU HAVE BCBS INSURANCE, YOU MAY HAVE BENEFITS FOR A SCREENING COLONOSCOPY BUT IF POLYPS ARE FOUND THE DIAGNOSIS WILL CHANGE AND THEN YOU MAY HAVE A DEDUCTIBLE THAT WILL NEED TO BE MET. SO PLEASE MAKE SURE YOU CHECK YOUR BENEFITS FOR A SCREENING COLONOSCOPY AS WELL AS A DIAGNOSTIC COLONOSCOPY.

## 2019-02-11 NOTE — Progress Notes (Addendum)
Gastroenterology Pre-Procedure Review  Request Date: 02/11/2019 Requesting Physician: Dr. Dennard Schaumann, Last TCS 2014 done by Dr. Sunny Schlein @ [facility], adenomatous polyps per PCP  PATIENT REVIEW QUESTIONS: The patient responded to the following health history questions as indicated:    1. Diabetes Melitis: no 2. Joint replacements in the past 12 months: no 3. Major health problems in the past 3 months: no 4. Has an artificial valve or MVP: no 5. Has a defibrillator: no 6. Has been advised in past to take antibiotics in advance of a procedure like teeth cleaning: yes, but not since 2011 7. Family history of colon cancer: no  8. Alcohol Use: no 9. Illicit drug Use: no 10. History of sleep apnea: no  11. History of coronary artery or other vascular stents placed within the last 12 months: no 12. History of any prior anesthesia complications: yes, n/v 13. There is no height or weight on file to calculate BMI.ht: 5'11 wt: 225 lbs     MEDICATIONS & ALLERGIES:    Patient reports the following regarding taking any blood thinners:   Plavix? no Aspirin? yes Coumadin? no Brilinta? no Xarelto? no Eliquis? no Pradaxa? no Savaysa? no Effient? no  Patient confirms/reports the following medications:  Current Outpatient Medications  Medication Sig Dispense Refill  . aspirin 81 MG tablet Take 81 mg by mouth daily.    Marland Kitchen ibuprofen (ADVIL,MOTRIN) 200 MG tablet Take 400 mg by mouth every 8 (eight) hours as needed (for pain.).    Marland Kitchen lisinopril (ZESTRIL) 40 MG tablet TAKE 1 TABLET(40 MG) BY MOUTH DAILY 90 tablet 3  . Multiple Vitamins-Minerals (MULTIVITAMIN WITH MINERALS) tablet Take 1 tablet by mouth daily.    . rosuvastatin (CRESTOR) 5 MG tablet Take 1 tablet (5 mg total) by mouth daily. 90 tablet 3  . sildenafil (VIAGRA) 100 MG tablet Take 1 tablet (100 mg total) by mouth daily as needed for erectile dysfunction. 10 tablet 5   No current facility-administered medications for this visit.      Patient confirms/reports the following allergies:  No Known Allergies  No orders of the defined types were placed in this encounter.   AUTHORIZATION INFORMATION Primary Insurance: Medicare,  ID #: [id number] Pre-Cert / Auth required: No, not required  Secondary Insurance: [place] ,  [place] #:[id number] ,  Group #: [id number] Pre-Cert / Auth required: Not required per Karen Kitchens / Auth #: Ref# Genia Del 02/12/2019  SCHEDULE INFORMATION: Procedure has been scheduled as follows:  Date: 05/06/2019, Time: 10:30 Location: [facility] with Dr. Gala Romney  This Gastroenterology Pre-Precedure Review Form is being routed to the following provider(s): Roseanne Kaufman, NP

## 2019-02-12 NOTE — Addendum Note (Signed)
Addended by: Metro Kung on: 02/12/2019 02:21 PM   Modules accepted: Orders, SmartSet

## 2019-02-12 NOTE — Progress Notes (Signed)
Appropriate.

## 2019-04-06 LAB — HM DIABETES EYE EXAM

## 2019-05-04 ENCOUNTER — Other Ambulatory Visit: Payer: Self-pay

## 2019-05-04 ENCOUNTER — Other Ambulatory Visit (HOSPITAL_COMMUNITY)
Admission: RE | Admit: 2019-05-04 | Discharge: 2019-05-04 | Disposition: A | Discharge status: Home / Self Care | Payer: Medicare Other | Source: Ambulatory Visit | Attending: Internal Medicine | Admitting: Internal Medicine

## 2019-05-04 DIAGNOSIS — Z20822 Contact with and (suspected) exposure to covid-19: Secondary | ICD-10-CM | POA: Insufficient documentation

## 2019-05-04 DIAGNOSIS — Z01812 Encounter for preprocedural laboratory examination: Secondary | ICD-10-CM | POA: Diagnosis present

## 2019-05-04 LAB — SARS CORONAVIRUS 2 (TAT 6-24 HRS): SARS Coronavirus 2: NEGATIVE

## 2019-05-06 ENCOUNTER — Ambulatory Visit (HOSPITAL_COMMUNITY)
Admission: RE | Admit: 2019-05-06 | Discharge: 2019-05-06 | Disposition: A | Discharge status: Home / Self Care | Payer: Medicare Other | Attending: Internal Medicine | Admitting: Internal Medicine

## 2019-05-06 ENCOUNTER — Other Ambulatory Visit: Payer: Self-pay

## 2019-05-06 ENCOUNTER — Encounter (HOSPITAL_COMMUNITY)
Admission: RE | Disposition: A | Discharge status: Home / Self Care | Payer: Self-pay | Source: Home / Self Care | Attending: Internal Medicine

## 2019-05-06 ENCOUNTER — Encounter (HOSPITAL_COMMUNITY): Payer: Self-pay | Admitting: Internal Medicine

## 2019-05-06 DIAGNOSIS — Z96643 Presence of artificial hip joint, bilateral: Secondary | ICD-10-CM | POA: Diagnosis not present

## 2019-05-06 DIAGNOSIS — Z79899 Other long term (current) drug therapy: Secondary | ICD-10-CM | POA: Insufficient documentation

## 2019-05-06 DIAGNOSIS — Z9841 Cataract extraction status, right eye: Secondary | ICD-10-CM | POA: Diagnosis not present

## 2019-05-06 DIAGNOSIS — K635 Polyp of colon: Secondary | ICD-10-CM

## 2019-05-06 DIAGNOSIS — Z820 Family history of epilepsy and other diseases of the nervous system: Secondary | ICD-10-CM | POA: Insufficient documentation

## 2019-05-06 DIAGNOSIS — Z8261 Family history of arthritis: Secondary | ICD-10-CM | POA: Insufficient documentation

## 2019-05-06 DIAGNOSIS — K5730 Diverticulosis of large intestine without perforation or abscess without bleeding: Secondary | ICD-10-CM | POA: Diagnosis not present

## 2019-05-06 DIAGNOSIS — Z8249 Family history of ischemic heart disease and other diseases of the circulatory system: Secondary | ICD-10-CM | POA: Insufficient documentation

## 2019-05-06 DIAGNOSIS — R7303 Prediabetes: Secondary | ICD-10-CM | POA: Diagnosis not present

## 2019-05-06 DIAGNOSIS — D124 Benign neoplasm of descending colon: Secondary | ICD-10-CM | POA: Diagnosis not present

## 2019-05-06 DIAGNOSIS — Z1211 Encounter for screening for malignant neoplasm of colon: Secondary | ICD-10-CM | POA: Insufficient documentation

## 2019-05-06 DIAGNOSIS — M1990 Unspecified osteoarthritis, unspecified site: Secondary | ICD-10-CM | POA: Insufficient documentation

## 2019-05-06 DIAGNOSIS — Z823 Family history of stroke: Secondary | ICD-10-CM | POA: Diagnosis not present

## 2019-05-06 DIAGNOSIS — Z8349 Family history of other endocrine, nutritional and metabolic diseases: Secondary | ICD-10-CM | POA: Diagnosis not present

## 2019-05-06 DIAGNOSIS — Z86010 Personal history of colonic polyps: Secondary | ICD-10-CM | POA: Insufficient documentation

## 2019-05-06 DIAGNOSIS — E785 Hyperlipidemia, unspecified: Secondary | ICD-10-CM | POA: Diagnosis not present

## 2019-05-06 DIAGNOSIS — Z7982 Long term (current) use of aspirin: Secondary | ICD-10-CM | POA: Diagnosis not present

## 2019-05-06 DIAGNOSIS — Z9842 Cataract extraction status, left eye: Secondary | ICD-10-CM | POA: Diagnosis not present

## 2019-05-06 DIAGNOSIS — Z961 Presence of intraocular lens: Secondary | ICD-10-CM | POA: Diagnosis not present

## 2019-05-06 DIAGNOSIS — I10 Essential (primary) hypertension: Secondary | ICD-10-CM | POA: Diagnosis not present

## 2019-05-06 DIAGNOSIS — Z833 Family history of diabetes mellitus: Secondary | ICD-10-CM | POA: Insufficient documentation

## 2019-05-06 DIAGNOSIS — D122 Benign neoplasm of ascending colon: Secondary | ICD-10-CM | POA: Diagnosis not present

## 2019-05-06 HISTORY — PX: COLONOSCOPY: SHX5424

## 2019-05-06 HISTORY — DX: Other complications of anesthesia, initial encounter: T88.59XA

## 2019-05-06 HISTORY — DX: Other specified postprocedural states: Z98.890

## 2019-05-06 HISTORY — PX: POLYPECTOMY: SHX5525

## 2019-05-06 HISTORY — DX: Nausea with vomiting, unspecified: R11.2

## 2019-05-06 SURGERY — COLONOSCOPY
Anesthesia: Moderate Sedation

## 2019-05-06 MED ORDER — MIDAZOLAM HCL 5 MG/5ML IJ SOLN
INTRAMUSCULAR | Status: DC | PRN
  Administered 2019-05-06: 2 mg via INTRAVENOUS
  Administered 2019-05-06: 1 mg via INTRAVENOUS
  Administered 2019-05-06: 2 mg via INTRAVENOUS
  Administered 2019-05-06: 1 mg via INTRAVENOUS
  Administered 2019-05-06: 2 mg via INTRAVENOUS
  Administered 2019-05-06: 1 mg via INTRAVENOUS

## 2019-05-06 MED ORDER — MIDAZOLAM HCL 5 MG/5ML IJ SOLN
INTRAMUSCULAR | Status: AC
  Filled 2019-05-06: qty 10

## 2019-05-06 MED ORDER — STERILE WATER FOR IRRIGATION IR SOLN
Status: DC | PRN
  Administered 2019-05-06: 1.5 mL

## 2019-05-06 MED ORDER — MEPERIDINE HCL 100 MG/ML IJ SOLN
INTRAMUSCULAR | Status: DC | PRN
  Administered 2019-05-06: 25 mg via INTRAVENOUS

## 2019-05-06 MED ORDER — ONDANSETRON HCL 4 MG/2ML IJ SOLN
INTRAMUSCULAR | Status: AC
  Filled 2019-05-06: qty 2

## 2019-05-06 MED ORDER — MEPERIDINE HCL 50 MG/ML IJ SOLN
INTRAMUSCULAR | Status: AC
  Filled 2019-05-06: qty 1

## 2019-05-06 MED ORDER — SODIUM CHLORIDE 0.9 % IV SOLN: INTRAVENOUS | Status: DC

## 2019-05-06 MED ORDER — ONDANSETRON HCL 4 MG/2ML IJ SOLN
INTRAMUSCULAR | Status: DC | PRN
  Administered 2019-05-06: 4 mg via INTRAVENOUS

## 2019-05-06 NOTE — Op Note (Signed)
[facility] Patient Name: Ricky Lowe Procedure Date: 05/06/2019 10:38 AM MRN: [id number] Date of Birth: 06/12/1950 Attending MD: Norvel Richards , MD CSN: [id number] Age: 69 Admit Type: Outpatient Procedure:                Colonoscopy Indications:              High risk colon cancer surveillance: Personal                            history of colonic polyps Providers:                Norvel Richards, MD, Charlsie Quest. Theda Sers RN, RN,                            Aram Candela Referring MD:              Medicines:                Midazolam 9 mg IV, Meperidine 25 mg IV, Ondansetron                            4 mg IV Complications:            No immediate complications. Estimated Blood Loss:     Estimated blood loss: none. Procedure:                Pre-Anesthesia Assessment:                           - Prior to the procedure, a History and Physical                            was performed, and patient medications and                            allergies were reviewed. The patient's tolerance of                            previous anesthesia was also reviewed. The risks                            and benefits of the procedure and the sedation                            options and risks were discussed with the patient.                            All questions were answered, and informed consent                            was obtained. Prior Anticoagulants: The patient has                            taken no previous anticoagulant or antiplatelet  agents. ASA Grade Assessment: II - A patient with                            mild systemic disease. After reviewing the risks                            and benefits, the patient was deemed in                            satisfactory condition to undergo the procedure.                           After obtaining informed consent, the colonoscope                            was passed under direct vision. Throughout  the                            procedure, the patient's blood pressure, pulse, and                            oxygen saturations were monitored continuously. The                            [id number] scope was introduced through                            the anus and advanced to the the cecum, identified                            by appendiceal orifice and ileocecal valve. The                            colonoscopy was performed without difficulty. The                            patient tolerated the procedure well. The quality                            of the bowel preparation was adequate. Scope In: [id number] AM Scope Out: 11:22:48 AM Scope Withdrawal Time: 0 hours 12 minutes 52 seconds  Total Procedure Duration: 0 hours 15 minutes 51 seconds  Findings:      The perianal and digital rectal examinations were normal.      Two sessile polyps were found in the descending colon and ascending       colon. The polyps were 3 to 4 mm in size. These polyps were removed with       a cold snare. Resection and retrieval were complete. Estimated blood       loss was minimal.      Scattered small-mouthed diverticula were found in the sigmoid colon.      The exam was otherwise without abnormality on direct and retroflexion       views. Impression:               -  Two 3 to 4 mm polyps in the descending colon and                            in the ascending colon, removed with a cold snare.                            Resected and retrieved.                           - Diverticulosis in the sigmoid colon.                           - The examination was otherwise normal on direct                            and retroflexion views. Moderate Sedation:      Moderate (conscious) sedation was administered by the endoscopy nurse       and supervised by the endoscopist. The following parameters were       monitored: oxygen saturation, heart rate, blood pressure, respiratory       rate, EKG, adequacy  of pulmonary ventilation, and response to care. Recommendation:           - Patient has a contact number available for                            emergencies. The signs and symptoms of potential                            delayed complications were discussed with the                            patient. Return to normal activities tomorrow.                            Written discharge instructions were provided to the                            patient.                           - Resume previous diet.                           - Continue present medications.                           - Repeat colonoscopy date to be determined after                            pending pathology results are reviewed for                            surveillance.                           - Return to  GI office after studies are complete. Procedure Code(s):        --- Professional ---                           [phone], Colonoscopy, flexible; with removal of                            tumor(s), polyp(s), or other lesion(s) by snare                            technique Diagnosis Code(s):        --- Professional ---                           Z86.010, Personal history of colonic polyps                           K63.5, Polyp of colon                           K57.30, Diverticulosis of large intestine without                            perforation or abscess without bleeding CPT copyright 2019 American Medical Association. All rights reserved. The codes documented in this report are preliminary and upon coder review may  be revised to meet current compliance requirements. Cristopher Estimable. Nahomi Hegner, MD Norvel Richards, MD 05/06/2019 11:31:26 AM This report has been signed electronically. Number of Addenda: 0

## 2019-05-06 NOTE — H&P (Signed)
@LOGO @   Primary Care Physician:  Susy Frizzle, MD Primary Gastroenterologist:  Dr. Gala Romney  Pre-Procedure History & Physical: HPI:  Ricky Lowe is a 69 y.o. male here for surveillance colonoscopy.  History of colon polyps removed elsewhere some 6 years ago  Past Medical History:  Diagnosis Date  . Arthritis   . Cataract   . Colon polyps    Dr. Sunny Schlein  . Colon polyps   . Complication of anesthesia   . Dyslipidemia   . Hypertension   . PONV (postoperative nausea and vomiting)   . Prediabetes     Past Surgical History:  Procedure Laterality Date  . CATARACT EXTRACTION W/PHACO Left 12/21/2016   Procedure: CATARACT EXTRACTION PHACO AND INTRAOCULAR LENS PLACEMENT (IOC);  Surgeon: Baruch Goldmann, MD;  Location: [facility];  Service: Ophthalmology;  Laterality: Left;  CDE: 2.63  . CATARACT EXTRACTION W/PHACO Right 01/18/2017   Procedure: CATARACT EXTRACTION PHACO AND INTRAOCULAR LENS PLACEMENT RIGHT EYE;  Surgeon: Baruch Goldmann, MD;  Location: [facility];  Service: Ophthalmology;  Laterality: Right;  CDE: 4.88  . JOINT REPLACEMENT  2010 / 2012   rt hip / lt hip    Prior to Admission medications   Medication Sig Start Date End Date Taking? Authorizing Provider  aspirin 81 MG tablet Take 81 mg by mouth daily.   Yes [provider]  lisinopril (ZESTRIL) 40 MG tablet TAKE 1 TABLET(40 MG) BY MOUTH DAILY Patient taking differently: Take 40 mg by mouth daily.  12/25/18  Yes Susy Frizzle, MD  rosuvastatin (CRESTOR) 5 MG tablet Take 1 tablet (5 mg total) by mouth daily. 12/25/18  Yes Susy Frizzle, MD  sildenafil (VIAGRA) 100 MG tablet Take 1 tablet (100 mg total) by mouth daily as needed for erectile dysfunction. Patient not taking: Reported on 04/30/2019 12/25/18   Susy Frizzle, MD    Allergies as of 02/12/2019  . (No Known Allergies)    Family History  Problem Relation Age of Onset  . Diabetes Mother   . Hearing loss Mother   . Heart disease Mother   .  Hyperlipidemia Mother   . Stroke Mother   . Vision loss Mother   . Hypertension Father   . Arthritis Maternal Grandmother     Social History   Socioeconomic History  . Marital status: Married    Spouse name: Not on file  . Number of children: Not on file  . Years of education: Not on file  . Highest education level: Not on file  Occupational History  . Not on file  Tobacco Use  . Smoking status: Never Smoker  . Smokeless tobacco: Former Network engineer and Sexual Activity  . Alcohol use: Yes  . Drug use: No  . Sexual activity: Yes    Birth control/protection: None  Other Topics Concern  . Not on file  Social History Narrative  . Not on file   Social Determinants of Health   Financial Resource Strain:   . Difficulty of Paying Living Expenses: Not on file  Food Insecurity:   . Worried About Charity fundraiser in the Last Year: Not on file  . Ran Out of Food in the Last Year: Not on file  Transportation Needs:   . Lack of Transportation (Medical): Not on file  . Lack of Transportation (Non-Medical): Not on file  Physical Activity:   . Days of Exercise per Week: Not on file  . Minutes of Exercise per Session: Not on file  Stress:   . Feeling of Stress : Not on file  Social Connections:   . Frequency of Communication with Friends and Family: Not on file  . Frequency of Social Gatherings with Friends and Family: Not on file  . Attends Religious Services: Not on file  . Active Member of Clubs or Organizations: Not on file  . Attends Archivist Meetings: Not on file  . Marital Status: Not on file  Intimate Partner Violence:   . Fear of Current or Ex-Partner: Not on file  . Emotionally Abused: Not on file  . Physically Abused: Not on file  . Sexually Abused: Not on file    Review of Systems: See HPI, otherwise negative ROS  Physical Exam: BP (!) 146/72   Pulse 84   Temp 98.7 F (37.1 C) (Oral)   Resp 11   Ht 5\' 11"  (1.803 m)   SpO2 97%   BMI  33.05 kg/m  General:   Alert,  Well-developed, well-nourished, pleasant and cooperative in NAD Neck:  Supple; no masses or thyromegaly. No significant cervical adenopathy. Lungs:  Clear throughout to auscultation.   No wheezes, crackles, or rhonchi. No acute distress. Heart:  Regular rate and rhythm; no murmurs, clicks, rubs,  or gallops. Abdomen: Non-distended, normal bowel sounds.  Soft and nontender without appreciable mass or hepatosplenomegaly.  Pulses:  Normal pulses noted. Extremities:  Without clubbing or edema.  Impression/Plan:  69 year old here for surveillance colonoscopy.  History of colonic polyps. The risks, benefits, limitations, alternatives and imponderables have been reviewed with the patient. Questions have been answered. All parties are agreeable.      Notice: This dictation was prepared with Dragon dictation along with smaller phrase technology. Any transcriptional errors that result from this process are unintentional and may not be corrected upon review.

## 2019-05-06 NOTE — Discharge Instructions (Signed)
Colonoscopy Discharge Instructions  Read the instructions outlined below and refer to this sheet in the next few weeks. These discharge instructions provide you with general information on caring for yourself after you leave the hospital. Your doctor may also give you specific instructions. While your treatment has been planned according to the most current medical practices available, unavoidable complications occasionally occur. If you have any problems or questions after discharge, call Dr. Gala Romney at [phone]. ACTIVITY  You may resume your regular activity, but move at a slower pace for the next 24 hours.   Take frequent rest periods for the next 24 hours.   Walking will help get rid of the air and reduce the bloated feeling in your belly (abdomen).   No driving for 24 hours (because of the medicine (anesthesia) used during the test).    Do not sign any important legal documents or operate any machinery for 24 hours (because of the anesthesia used during the test).  NUTRITION  Drink plenty of fluids.   You may resume your normal diet as instructed by your doctor.   Begin with a light meal and progress to your normal diet. Heavy or fried foods are harder to digest and may make you feel sick to your stomach (nauseated).   Avoid alcoholic beverages for 24 hours or as instructed.  MEDICATIONS  You may resume your normal medications unless your doctor tells you otherwise.  WHAT YOU CAN EXPECT TODAY  Some feelings of bloating in the abdomen.   Passage of more gas than usual.   Spotting of blood in your stool or on the toilet paper.  IF YOU HAD POLYPS REMOVED DURING THE COLONOSCOPY:  No aspirin products for 7 days or as instructed.   No alcohol for 7 days or as instructed.   Eat a soft diet for the next 24 hours.  FINDING OUT THE RESULTS OF YOUR TEST Not all test results are available during your visit. If your test results are not back during the visit, make an appointment  with your caregiver to find out the results. Do not assume everything is normal if you have not heard from your caregiver or the medical facility. It is important for you to follow up on all of your test results.  SEEK IMMEDIATE MEDICAL ATTENTION IF:  You have more than a spotting of blood in your stool.   Your belly is swollen (abdominal distention).   You are nauseated or vomiting.   You have a temperature over 101.   You have abdominal pain or discomfort that is severe or gets worse throughout the day.    Colon polyp and diverticulosis information provided  Further recommendations to follow pending review of pathology report  At patient request I called Marcie Bal, wife, [phone] and reviewed results.    Colon Polyps  Polyps are tissue growths inside the body. Polyps can grow in many places, including the large intestine (colon). A polyp may be a round bump or a mushroom-shaped growth. You could have one polyp or several. Most colon polyps are noncancerous (benign). However, some colon polyps can become cancerous over time. Finding and removing the polyps early can help prevent this. What are the causes? The exact cause of colon polyps is not known. What increases the risk? You are more likely to develop this condition if you:  Have a family history of colon cancer or colon polyps.  Are older than 68 or older than 45 if you are African American.  Have inflammatory  bowel disease, such as ulcerative colitis or Crohn's disease.  Have certain hereditary conditions, such as: ? Familial adenomatous polyposis. ? Lynch syndrome. ? Turcot syndrome. ? Peutz-Jeghers syndrome.  Are overweight.  Smoke cigarettes.  Do not get enough exercise.  Drink too much alcohol.  Eat a diet that is high in fat and red meat and low in fiber.  Had childhood cancer that was treated with abdominal radiation. What are the signs or symptoms? Most polyps do not cause symptoms. If you have  symptoms, they may include:  Blood coming from your rectum when having a bowel movement.  Blood in your stool. The stool may look dark red or black.  Abdominal pain.  A change in bowel habits, such as constipation or diarrhea. How is this diagnosed? This condition is diagnosed with a colonoscopy. This is a procedure in which a lighted, flexible scope is inserted into the anus and then passed into the colon to examine the area. Polyps are sometimes found when a colonoscopy is done as part of routine cancer screening tests. How is this treated? Treatment for this condition involves removing any polyps that are found. Most polyps can be removed during a colonoscopy. Those polyps will then be tested for cancer. Additional treatment may be needed depending on the results of testing. Follow these instructions at home: Lifestyle  Maintain a healthy weight, or lose weight if recommended by your health care provider.  Exercise every day or as told by your health care provider.  Do not use any products that contain nicotine or tobacco, such as cigarettes and e-cigarettes. If you need help quitting, ask your health care provider.  If you drink alcohol, limit how much you have: ? 0-1 drink a day for women. ? 0-2 drinks a day for men.  Be aware of how much alcohol is in your drink. In the [place] one drink equals one 12 oz bottle of beer (355 mL), one 5 oz glass of wine (148 mL), or one 1 oz shot of hard liquor (44 mL). Eating and drinking   Eat foods that are high in fiber, such as fruits, vegetables, and whole grains.  Eat foods that are high in calcium and vitamin D, such as milk, cheese, yogurt, eggs, liver, fish, and broccoli.  Limit foods that are high in fat, such as fried foods and desserts.  Limit the amount of red meat and processed meat you eat, such as hot dogs, sausage, bacon, and lunch meats. General instructions  Keep all follow-up visits as told by your health care provider.  This is important. ? This includes having regularly scheduled colonoscopies. ? Talk to your health care provider about when you need a colonoscopy. Contact a health care provider if:  You have new or worsening bleeding during a bowel movement.  You have new or increased blood in your stool.  You have a change in bowel habits.  You lose weight for no known reason. Summary  Polyps are tissue growths inside the body. Polyps can grow in many places, including the colon.  Most colon polyps are noncancerous (benign), but some can become cancerous over time.  This condition is diagnosed with a colonoscopy.  Treatment for this condition involves removing any polyps that are found. Most polyps can be removed during a colonoscopy. This information is not intended to replace advice given to you by your health care provider. Make sure you discuss any questions you have with your health care provider. Document Revised: 07/18/2017 Document Reviewed:  07/18/2017 Elsevier Patient Education  [place].    Diverticulosis  Diverticulosis is a condition that develops when small pouches (diverticula) form in the wall of the large intestine (colon). The colon is where water is absorbed and stool (feces) is formed. The pouches form when the inside layer of the colon pushes through weak spots in the outer layers of the colon. You may have a few pouches or many of them. The pouches usually do not cause problems unless they become inflamed or infected. When this happens, the condition is called diverticulitis. What are the causes? The cause of this condition is not known. What increases the risk? The following factors may make you more likely to develop this condition:  Being older than age 13. Your risk for this condition increases with age. Diverticulosis is rare among people younger than age 19. By age 67, many people have it.  Eating a low-fiber diet.  Having frequent constipation.  Being  overweight.  Not getting enough exercise.  Smoking.  Taking over-the-counter pain medicines, like aspirin and ibuprofen.  Having a family history of diverticulosis. What are the signs or symptoms? In most people, there are no symptoms of this condition. If you do have symptoms, they may include:  Bloating.  Cramps in the abdomen.  Constipation or diarrhea.  Pain in the lower left side of the abdomen. How is this diagnosed? Because diverticulosis usually has no symptoms, it is most often diagnosed during an exam for other colon problems. The condition may be diagnosed by:  Using a flexible scope to examine the colon (colonoscopy).  Taking an X-ray of the colon after dye has been put into the colon (barium enema).  Having a CT scan. How is this treated? You may not need treatment for this condition. Your health care provider may recommend treatment to prevent problems. You may need treatment if you have symptoms or if you previously had diverticulitis. Treatment may include:  Eating a high-fiber diet.  Taking a fiber supplement.  Taking a live bacteria supplement (probiotic).  Taking medicine to relax your colon. Follow these instructions at home: Medicines  Take over-the-counter and prescription medicines only as told by your health care provider.  If told by your health care provider, take a fiber supplement or probiotic. Constipation prevention Your condition may cause constipation. To prevent or treat constipation, you may need to:  Drink enough fluid to keep your urine pale yellow.  Take over-the-counter or prescription medicines.  Eat foods that are high in fiber, such as beans, whole grains, and fresh fruits and vegetables.  Limit foods that are high in fat and processed sugars, such as fried or sweet foods.  General instructions  Try not to strain when you have a bowel movement.  Keep all follow-up visits as told by your health care provider. This is  important. Contact a health care provider if you:  Have pain in your abdomen.  Have bloating.  Have cramps.  Have not had a bowel movement in 3 days. Get help right away if:  Your pain gets worse.  Your bloating becomes very bad.  You have a fever or chills, and your symptoms suddenly get worse.  You vomit.  You have bowel movements that are bloody or black.  You have bleeding from your rectum. Summary  Diverticulosis is a condition that develops when small pouches (diverticula) form in the wall of the large intestine (colon).  You may have a few pouches or many of them.  This condition is most often diagnosed during an exam for other colon problems.  Treatment may include increasing the fiber in your diet, taking supplements, or taking medicines. This information is not intended to replace advice given to you by your health care provider. Make sure you discuss any questions you have with your health care provider. Document Revised: 10/30/2018 Document Reviewed: 10/30/2018 Elsevier Patient Education  [place].

## 2019-05-07 ENCOUNTER — Encounter: Payer: Self-pay | Admitting: Internal Medicine

## 2019-05-07 LAB — SURGICAL PATHOLOGY

## 2019-05-14 ENCOUNTER — Encounter: Payer: Self-pay | Admitting: *Deleted

## 2019-09-23 ENCOUNTER — Encounter: Payer: Self-pay | Admitting: Nurse Practitioner

## 2019-09-23 ENCOUNTER — Ambulatory Visit (INDEPENDENT_AMBULATORY_CARE_PROVIDER_SITE_OTHER): Payer: Medicare Other | Admitting: Nurse Practitioner

## 2019-09-23 ENCOUNTER — Other Ambulatory Visit: Payer: Self-pay

## 2019-09-23 VITALS — BP 122/80 | HR 61 | Temp 97.8°F | Resp 18 | Wt 233.4 lb

## 2019-09-23 DIAGNOSIS — S20461A Insect bite (nonvenomous) of right back wall of thorax, initial encounter: Secondary | ICD-10-CM | POA: Diagnosis not present

## 2019-09-23 DIAGNOSIS — W57XXXA Bitten or stung by nonvenomous insect and other nonvenomous arthropods, initial encounter: Secondary | ICD-10-CM | POA: Diagnosis not present

## 2019-09-23 MED ORDER — DOXYCYCLINE HYCLATE 100 MG PO TABS: 100.0000 mg | ORAL_TABLET | Freq: Two times a day (BID) | ORAL | 0 refills | Status: DC

## 2019-09-23 NOTE — Progress Notes (Signed)
Established Patient Office Visit  Subjective:  Patient ID: Ricky Lowe, male    DOB: [birth year]  Age: 69 y.o. MRN: [id number]  CC:  Chief Complaint  Patient presents with  . Insect Bite    tick bite on back, x1 week, itching some    HPI Ricky Lowe is a 69 year old male removed a tick on his right mid back one week ago. He presents today r/t the area has become larger, increased redness, becoming firm, and he is concerned the area is becoming infected or he may have Lyme disease.  No fever, chills, drainage, denied trying to burst/pop or manipulate the affected area.   Past Medical History:  Diagnosis Date  . Arthritis   . Cataract   . Colon polyps    Dr. Sunny Schlein  . Colon polyps   . Complication of anesthesia   . Dyslipidemia   . Hypertension   . PONV (postoperative nausea and vomiting)   . Prediabetes     Past Surgical History:  Procedure Laterality Date  . CATARACT EXTRACTION W/PHACO Left 12/21/2016   Procedure: CATARACT EXTRACTION PHACO AND INTRAOCULAR LENS PLACEMENT (IOC);  Surgeon: Baruch Goldmann, MD;  Location: [facility];  Service: Ophthalmology;  Laterality: Left;  CDE: 2.63  . CATARACT EXTRACTION W/PHACO Right 01/18/2017   Procedure: CATARACT EXTRACTION PHACO AND INTRAOCULAR LENS PLACEMENT RIGHT EYE;  Surgeon: Baruch Goldmann, MD;  Location: [facility];  Service: Ophthalmology;  Laterality: Right;  CDE: 4.88  . COLONOSCOPY N/A 05/06/2019   Procedure: COLONOSCOPY;  Surgeon: Daneil Dolin, MD;  Location: [facility];  Service: Endoscopy;  Laterality: N/A;  10:30  . JOINT REPLACEMENT  2010 / 2012   rt hip / lt hip  . POLYPECTOMY  05/06/2019   Procedure: POLYPECTOMY;  Surgeon: Daneil Dolin, MD;  Location: [facility];  Service: Endoscopy;;    Family History  Problem Relation Age of Onset  . Diabetes Mother   . Hearing loss Mother   . Heart disease Mother   . Hyperlipidemia Mother   . Stroke Mother   . Vision loss Mother   . Hypertension Father   . Arthritis  Maternal Grandmother     Social History   Socioeconomic History  . Marital status: Married    Spouse name: Not on file  . Number of children: Not on file  . Years of education: Not on file  . Highest education level: Not on file  Occupational History  . Not on file  Tobacco Use  . Smoking status: Never Smoker  . Smokeless tobacco: Former Network engineer and Sexual Activity  . Alcohol use: Yes  . Drug use: No  . Sexual activity: Yes    Birth control/protection: None  Other Topics Concern  . Not on file  Social History Narrative  . Not on file   Social Determinants of Health   Financial Resource Strain:   . Difficulty of Paying Living Expenses:   Food Insecurity:   . Worried About Charity fundraiser in the Last Year:   . Arboriculturist in the Last Year:   Transportation Needs:   . Film/video editor (Medical):   Marland Kitchen Lack of Transportation (Non-Medical):   Physical Activity:   . Days of Exercise per Week:   . Minutes of Exercise per Session:   Stress:   . Feeling of Stress :   Social Connections:   . Frequency of Communication with Friends and Family:   . Frequency  of Social Gatherings with Friends and Family:   . Attends Religious Services:   . Active Member of Clubs or Organizations:   . Attends Archivist Meetings:   Marland Kitchen Marital Status:   Intimate Partner Violence:   . Fear of Current or Ex-Partner:   . Emotionally Abused:   Marland Kitchen Physically Abused:   . Sexually Abused:     Outpatient Medications Prior to Visit  Medication Sig Dispense Refill  . aspirin 81 MG tablet Take 81 mg by mouth daily.    Marland Kitchen lisinopril (ZESTRIL) 40 MG tablet TAKE 1 TABLET(40 MG) BY MOUTH DAILY (Patient taking differently: Take 40 mg by mouth daily. ) 90 tablet 3  . rosuvastatin (CRESTOR) 5 MG tablet Take 1 tablet (5 mg total) by mouth daily. 90 tablet 3   No facility-administered medications prior to visit.    No Known Allergies  ROS Review of Systems  All other  systems reviewed and are negative.     Objective:    Physical Exam  Constitutional: He is oriented to person, place, and time. He appears well-developed and well-nourished.  HENT:  Head: Normocephalic.  Eyes: Pupils are equal, round, and reactive to light. Conjunctivae and EOM are normal.  Neck: No JVD present.  Cardiovascular: Normal rate.  Pulmonary/Chest: Effort normal.  Neurological: He is alert and oriented to person, place, and time.  Skin: Skin is warm.     Erythremic, mild edema circular rash with center scabbed region, three very small pustules present non draining <.1cm in size. No bulls eye present  Psychiatric: He has a normal mood and affect. His behavior is normal. Thought content normal.    BP 122/80 (BP Location: Left Arm, Patient Position: Sitting, Cuff Size: Large)   Pulse 61   Temp 97.8 F (36.6 C) (Temporal)   Resp 18   Wt 233 lb 6.4 oz (105.9 kg)   SpO2 96%   BMI 32.55 kg/m  Wt Readings from Last 3 Encounters:  09/23/19 233 lb 6.4 oz (105.9 kg)  12/25/18 237 lb (107.5 kg)  08/23/17 236 lb (107 kg)     Health Maintenance Due  Topic Date Due  . COVID-19 Vaccine (2 - Pfizer 2-dose series) 06/29/2019    There are no preventive care reminders to display for this patient.  No results found for: TSH Lab Results  Component Value Date   WBC 5.0 12/23/2018   HGB 15.2 12/23/2018   HCT 45.3 12/23/2018   MCV 88.8 12/23/2018   PLT 178 12/23/2018   Lab Results  Component Value Date   NA 138 12/23/2018   K 4.5 12/23/2018   CO2 25 12/23/2018   GLUCOSE 137 (H) 12/23/2018   BUN 17 12/23/2018   CREATININE 1.12 12/23/2018   BILITOT 0.5 12/23/2018   ALKPHOS 43 08/08/2016   AST 28 12/23/2018   ALT 36 12/23/2018   PROT 6.5 12/23/2018   ALBUMIN 4.2 08/08/2016   CALCIUM 9.6 12/23/2018   Lab Results  Component Value Date   CHOL 211 (H) 12/23/2018   Lab Results  Component Value Date   HDL 38 (L) 12/23/2018   Lab Results  Component Value Date    LDLCALC 147 (H) 12/23/2018   Lab Results  Component Value Date   TRIG 136 12/23/2018   Lab Results  Component Value Date   CHOLHDL 5.6 (H) 12/23/2018   Lab Results  Component Value Date   HGBA1C 6.3 (H) 12/23/2018      Assessment & Plan:   Problem  List Items Addressed This Visit    None    Visit Diagnoses    Tick bite, initial encounter    -  Primary   Relevant Medications   doxycycline (VIBRA-TABS) 100 MG tablet    Will cover the affected area skin infection/tick bite with doxycycline.  Return for non resolving or worsening sxs.   Meds ordered this encounter  Medications  . doxycycline (VIBRA-TABS) 100 MG tablet    Sig: Take 1 tablet (100 mg total) by mouth 2 (two) times daily.    Dispense:  20 tablet    Refill:  0    Follow-up: Return if symptoms worsen or fail to improve.    Annie Main, FNP

## 2019-12-04 ENCOUNTER — Encounter (INDEPENDENT_AMBULATORY_CARE_PROVIDER_SITE_OTHER): Payer: Medicare Other | Admitting: Ophthalmology

## 2019-12-04 ENCOUNTER — Other Ambulatory Visit: Payer: Self-pay

## 2019-12-04 DIAGNOSIS — I10 Essential (primary) hypertension: Secondary | ICD-10-CM | POA: Diagnosis not present

## 2019-12-04 DIAGNOSIS — H35033 Hypertensive retinopathy, bilateral: Secondary | ICD-10-CM

## 2019-12-04 DIAGNOSIS — H35371 Puckering of macula, right eye: Secondary | ICD-10-CM | POA: Diagnosis not present

## 2019-12-04 DIAGNOSIS — D3131 Benign neoplasm of right choroid: Secondary | ICD-10-CM | POA: Diagnosis not present

## 2019-12-04 DIAGNOSIS — H43813 Vitreous degeneration, bilateral: Secondary | ICD-10-CM

## 2019-12-09 ENCOUNTER — Other Ambulatory Visit: Payer: Self-pay

## 2019-12-09 ENCOUNTER — Emergency Department (HOSPITAL_COMMUNITY): Payer: Medicare Other

## 2019-12-09 ENCOUNTER — Encounter (HOSPITAL_COMMUNITY): Payer: Self-pay

## 2019-12-09 ENCOUNTER — Emergency Department (HOSPITAL_COMMUNITY)
Admission: EM | Admit: 2019-12-09 | Discharge: 2019-12-09 | Disposition: A | Discharge status: Home / Self Care | Payer: Medicare Other | Attending: Emergency Medicine | Admitting: Emergency Medicine

## 2019-12-09 DIAGNOSIS — R531 Weakness: Secondary | ICD-10-CM | POA: Diagnosis not present

## 2019-12-09 DIAGNOSIS — Z7982 Long term (current) use of aspirin: Secondary | ICD-10-CM | POA: Insufficient documentation

## 2019-12-09 DIAGNOSIS — Z96641 Presence of right artificial hip joint: Secondary | ICD-10-CM | POA: Diagnosis not present

## 2019-12-09 DIAGNOSIS — I10 Essential (primary) hypertension: Secondary | ICD-10-CM | POA: Insufficient documentation

## 2019-12-09 DIAGNOSIS — Z96642 Presence of left artificial hip joint: Secondary | ICD-10-CM | POA: Diagnosis not present

## 2019-12-09 DIAGNOSIS — Z79899 Other long term (current) drug therapy: Secondary | ICD-10-CM | POA: Diagnosis not present

## 2019-12-09 DIAGNOSIS — R110 Nausea: Secondary | ICD-10-CM | POA: Insufficient documentation

## 2019-12-09 DIAGNOSIS — R270 Ataxia, unspecified: Secondary | ICD-10-CM

## 2019-12-09 LAB — DIFFERENTIAL
Abs Immature Granulocytes: 0.04 10*3/uL (ref 0.00–0.07)
Basophils Absolute: 0 10*3/uL (ref 0.0–0.1)
Basophils Relative: 0 %
Eosinophils Absolute: 0 10*3/uL (ref 0.0–0.5)
Eosinophils Relative: 0 %
Immature Granulocytes: 0 %
Lymphocytes Relative: 10 %
Lymphs Abs: 1.3 10*3/uL (ref 0.7–4.0)
Monocytes Absolute: 0.7 10*3/uL (ref 0.1–1.0)
Monocytes Relative: 5 %
Neutro Abs: 11.4 10*3/uL — ABNORMAL HIGH (ref 1.7–7.7)
Neutrophils Relative %: 85 %

## 2019-12-09 LAB — CBC
HCT: 43.3 % (ref 39.0–52.0)
Hemoglobin: 15 g/dL (ref 13.0–17.0)
MCH: 30.4 pg (ref 26.0–34.0)
MCHC: 34.6 g/dL (ref 30.0–36.0)
MCV: 87.8 fL (ref 80.0–100.0)
Platelets: 192 10*3/uL (ref 150–400)
RBC: 4.93 MIL/uL (ref 4.22–5.81)
RDW: 12.1 % (ref 11.5–15.5)
WBC: 13.6 10*3/uL — ABNORMAL HIGH (ref 4.0–10.5)
nRBC: 0 % (ref 0.0–0.2)

## 2019-12-09 LAB — COMPREHENSIVE METABOLIC PANEL
ALT: 41 U/L (ref 0–44)
AST: 43 U/L — ABNORMAL HIGH (ref 15–41)
Albumin: 4.6 g/dL (ref 3.5–5.0)
Alkaline Phosphatase: 40 U/L (ref 38–126)
Anion gap: 12 (ref 5–15)
BUN: 16 mg/dL (ref 8–23)
CO2: 22 mmol/L (ref 22–32)
Calcium: 9.1 mg/dL (ref 8.9–10.3)
Chloride: 100 mmol/L (ref 98–111)
Creatinine, Ser: 0.84 mg/dL (ref 0.61–1.24)
GFR calc Af Amer: >60 mL/min (ref >60)
GFR calc non Af Amer: >60 mL/min (ref >60)
Glucose, Bld: 165 mg/dL — ABNORMAL HIGH (ref 70–99)
Potassium: 4.2 mmol/L (ref 3.5–5.1)
Sodium: 134 mmol/L — ABNORMAL LOW (ref 135–145)
Total Bilirubin: 0.6 mg/dL (ref 0.3–1.2)
Total Protein: 7.5 g/dL (ref 6.5–8.1)

## 2019-12-09 LAB — ETHANOL: Alcohol, Ethyl (B): <10 mg/dL (ref <10)

## 2019-12-09 LAB — PROTIME-INR
INR: 1 (ref 0.8–1.2)
Prothrombin Time: 12.9 seconds (ref 11.4–15.2)

## 2019-12-09 LAB — APTT: aPTT: 22 seconds — ABNORMAL LOW (ref 24–36)

## 2019-12-09 NOTE — Consult Note (Signed)
[facility] Tele[facility]urology Consult Services  Stat Consult  Date of Service:   12/09/2019 20:03:08  Impression:     .  R27.0 - Ataxia (unsp)  Comments/Sign-Out: 69 year old male with a past medical history of HTN, HLD presents with transient symptoms of stumbling and unsteady gait upon waking up this AM. MRI negative for acute stroke. ED will discuss with patient that if ataxia does not resolve or if patient develops lower extremity weakness than he should come back to the ED for further eval Sabra Heck fischer variant). Follow up with neurology output.  Metrics: [facility] Notification Time: 12/09/2019 20:01:53 Stamp Time: 12/09/2019 20:03:08 Callback Response Time: 12/09/2019 20:04:47  Our recommendations are outlined below.   Disposition: Neurology Follow Up Recommended  Sign Out:     .  Discussed with Emergency Department Provider  ----------------------------------------------------------------------------------------------------  Chief Complaint: Transient unsteady gait.  History of Present Illness: Patient is a 69 year old Male.  69 year old male with a past medical history of HTN, HLD presents with transient symptoms of stumbling and unsteady gait upon waking up this AM. The unsteady gait lasted throughout the morning and than resolved. Patient denies unilateral weakness or slurred speech. Patient denies lightheadedness or sensation of vertigo. Exam is non focal NIHHS is zero. Patient denies bowel or bladder incontinence, or lower back pain or any features of lumbar radiculopathy.   Past Medical History:     . Hypertension     . Hyperlipidemia     . There is NO history of Diabetes Mellitus     . There is NO history of Coronary Artery Disease     . There is NO history of Stroke      Examination: BP(129/65), Pulse(64), Blood Glucose(165) 1A: Level of Consciousness - Alert; keenly responsive + 0 1B: Ask Month and Age - Both Questions Right  + 0 1C: Blink Eyes & Squeeze Hands - Performs Both Tasks + 0 2: Test Horizontal Extraocular Movements - Normal + 0 3: Test Visual Fields - No Visual Loss + 0 4: Test Facial Palsy (Use Grimace if Obtunded) - Normal symmetry + 0 5A: Test Left Arm Motor Drift - No Drift for 10 Seconds + 0 5B: Test Right Arm Motor Drift - No Drift for 10 Seconds + 0 6A: Test Left Leg Motor Drift - No Drift for 5 Seconds + 0 6B: Test Right Leg Motor Drift - No Drift for 5 Seconds + 0 7: Test Limb Ataxia (FNF/Heel-Shin) - No Ataxia + 0 8: Test Sensation - Normal; No sensory loss + 0 9: Test Language/Aphasia - Normal; No aphasia + 0 10: Test Dysarthria - Normal + 0 11: Test Extinction/Inattention - No abnormality + 0  NIHSS Score: 0    Patient is being evaluated for possible acute neurologic impairment and high probability of imminent or life-threatening deterioration. I spent total of 32 minutes providing care to this patient, including time for face to face visit via telemedicine, review of medical records, imaging studies and discussion of findings with providers, the patient and/or family.   Dr Jessica Priest   [facility] [phone]  Case [id number]

## 2019-12-09 NOTE — ED Provider Notes (Signed)
[facility] EMERGENCY DEPARTMENT Provider Note   CSN: [id number] Arrival date & time: 12/09/19  1634     History Chief Complaint  Patient presents with  . Fatigue    Ricky Lowe is a 69 y.o. male.  HPI     69 year old male comes in a chief complaint of weakness.  Patient has history of hypertension, hyperlipidemia.  He reports that he woke up this morning feeling weak, disoriented.  Ever since he woke up he has also been stumbling when he tries to walk.  At some point he had slurred speech.  There is no history of strokes, CAD.  Patient denies any new numbness, tingling, focal weakness.  He had numbness at one point with emesis x1.   Past Medical History:  Diagnosis Date  . Arthritis   . Cataract   . Colon polyps    Dr. Sunny Schlein  . Colon polyps   . Complication of anesthesia   . Dyslipidemia   . Hypertension   . PONV (postoperative nausea and vomiting)   . Prediabetes     Patient Active Problem List   Diagnosis Date Noted  . Erectile dysfunction 08/08/2016  . Colon polyps   . Cataract   . Hypertension     Past Surgical History:  Procedure Laterality Date  . CATARACT EXTRACTION W/PHACO Left 12/21/2016   Procedure: CATARACT EXTRACTION PHACO AND INTRAOCULAR LENS PLACEMENT (IOC);  Surgeon: Baruch Goldmann, MD;  Location: [facility];  Service: Ophthalmology;  Laterality: Left;  CDE: 2.63  . CATARACT EXTRACTION W/PHACO Right 01/18/2017   Procedure: CATARACT EXTRACTION PHACO AND INTRAOCULAR LENS PLACEMENT RIGHT EYE;  Surgeon: Baruch Goldmann, MD;  Location: [facility];  Service: Ophthalmology;  Laterality: Right;  CDE: 4.88  . COLONOSCOPY N/A 05/06/2019   Procedure: COLONOSCOPY;  Surgeon: Daneil Dolin, MD;  Location: [facility];  Service: Endoscopy;  Laterality: N/A;  10:30  . JOINT REPLACEMENT  2010 / 2012   rt hip / lt hip  . POLYPECTOMY  05/06/2019   Procedure: POLYPECTOMY;  Surgeon: Daneil Dolin, MD;  Location: [facility];  Service: Endoscopy;;       Family  History  Problem Relation Age of Onset  . Diabetes Mother   . Hearing loss Mother   . Heart disease Mother   . Hyperlipidemia Mother   . Stroke Mother   . Vision loss Mother   . Hypertension Father   . Arthritis Maternal Grandmother     Social History   Tobacco Use  . Smoking status: Never Smoker  . Smokeless tobacco: Former Network engineer  . Vaping Use: Never used  Substance Use Topics  . Alcohol use: Yes  . Drug use: No    Home Medications Prior to Admission medications   Medication Sig Start Date End Date Taking? Authorizing Provider  aspirin 81 MG tablet Take 81 mg by mouth daily.    [provider]  doxycycline (VIBRA-TABS) 100 MG tablet Take 1 tablet (100 mg total) by mouth 2 (two) times daily. 09/23/19   Ishmael Holter A, FNP  lisinopril (ZESTRIL) 40 MG tablet TAKE 1 TABLET(40 MG) BY MOUTH DAILY Patient taking differently: Take 40 mg by mouth daily.  12/25/18   Susy Frizzle, MD  rosuvastatin (CRESTOR) 5 MG tablet Take 1 tablet (5 mg total) by mouth daily. 12/25/18   Susy Frizzle, MD    Allergies    Patient has no known allergies.  Review of Systems   Review of Systems  Constitutional: Positive for activity change.  Respiratory: Negative for shortness of breath.   Cardiovascular: Negative for chest pain.  Gastrointestinal: Positive for nausea. Negative for vomiting.  Neurological: Positive for dizziness.  All other systems reviewed and are negative.   Physical Exam Updated Vital Signs BP (!) 144/61 (BP Location: Left Arm)   Pulse (!) 58   Temp (!) 97.5 F (36.4 C) (Oral)   Resp 16   Ht 6' (1.829 m)   Wt 104.3 kg   SpO2 96%   BMI 31.19 kg/m   Physical Exam Vitals and nursing note reviewed.  Constitutional:      Appearance: He is well-developed.  HENT:     Head: Atraumatic.  Eyes:     Pupils: Pupils are equal, round, and reactive to light.     Comments: Saccadic eye movement with lateral gaze.  Cardiovascular:     Rate and  Rhythm: Normal rate.  Pulmonary:     Effort: Pulmonary effort is normal.  Musculoskeletal:     Cervical back: Neck supple.  Skin:    General: Skin is warm.  Neurological:     General: No focal deficit present.     Mental Status: He is alert and oriented to person, place, and time.     Cranial Nerves: No cranial nerve deficit.     Sensory: No sensory deficit.     Motor: No weakness.     Gait: Gait abnormal.     ED Results / Procedures / Treatments   Labs (all labs ordered are listed, but only abnormal results are displayed) Labs Reviewed  ETHANOL  PROTIME-INR  APTT  CBC  DIFFERENTIAL  COMPREHENSIVE METABOLIC PANEL  RAPID URINE DRUG SCREEN, HOSP PERFORMED  URINALYSIS, ROUTINE W REFLEX MICROSCOPIC    EKG EKG Interpretation  Date/Time:  Wednesday December 09 2019 18:10:11 EDT Ventricular Rate:  65 PR Interval:  178 QRS Duration: 92 QT Interval:  430 QTC Calculation: 447 R Axis:   59 Text Interpretation: Normal sinus rhythm Normal ECG No acute changes No significant change since last tracing Confirmed by Varney Biles [phone]) on 12/09/2019 6:28:39 PM   Radiology No results found.  Procedures Procedures (including critical care time)  Medications Ordered in ED Medications - No data to display  ED Course  I have reviewed the triage vital signs and the nursing notes.  Pertinent labs & imaging results that were available during my care of the patient were reviewed by me and considered in my medical decision making (see chart for details).    MDM Rules/Calculators/A&P                          69 year old male with history of hypertension, hyperlipidemia comes in a chief complaint of weakness, ataxic gait and disorientation.  He is oriented x3 for [place].  Neuro exam reveals ataxic gait and nystagmus.  No headache, neck pain.  No trauma.  Clinical concerns are high for stroke.  Electrolyte abnormality, arrhythmia also considered.  Final Clinical Impression(s) / ED  Diagnoses Final diagnoses:  None    Rx / DC Orders ED Discharge Orders    None       Varney Biles, MD 12/09/19 [phone]

## 2019-12-09 NOTE — ED Notes (Signed)
Patient states he spit up some spetum in his mask and on his covers. Patient given wash cloth, changed linen on bed.

## 2019-12-09 NOTE — ED Triage Notes (Signed)
Pt to er, pt opens eye and answers questions to verbal stimulation, states that he took two benadryl last night, states that he didn't take any today.  Pt states that the itching is better. Pt emitting slight snoring like noise,  Per ems pt was in some chiggers yesterday and had some itchiness and was taking benadryl, states that he is here today because he had a decreased level of consciousness.

## 2019-12-09 NOTE — Discharge Instructions (Addendum)
We saw you in the ER for the balance issues. All the results in the ER are normal, labs and imaging.  We are not sure what is causing your symptoms. The workup in the ER is not complete, and is limited to screening for life threatening and emergent conditions only, so please see a neurologist as requested in 5 to 7 days.  Return to the ER if your balance gets worse, you have fainting spell, new neurologic symptoms such as one-sided numbness, weakness, vision change, confusion, slurred speech.

## 2019-12-19 ENCOUNTER — Other Ambulatory Visit: Payer: Self-pay | Admitting: Family Medicine

## 2020-03-22 ENCOUNTER — Other Ambulatory Visit: Payer: Self-pay | Admitting: Family Medicine

## 2020-03-22 DIAGNOSIS — I10 Essential (primary) hypertension: Secondary | ICD-10-CM

## 2020-04-22 ENCOUNTER — Encounter: Payer: Self-pay | Admitting: Family Medicine

## 2020-04-25 NOTE — Telephone Encounter (Signed)
Call placed to patient and patient made aware.   Appointment scheduled.  

## 2020-05-03 ENCOUNTER — Ambulatory Visit: Payer: Self-pay | Admitting: Family Medicine

## 2020-05-05 ENCOUNTER — Other Ambulatory Visit: Payer: Self-pay

## 2020-05-05 ENCOUNTER — Ambulatory Visit (INDEPENDENT_AMBULATORY_CARE_PROVIDER_SITE_OTHER): Payer: Medicare Other | Admitting: Family Medicine

## 2020-05-05 ENCOUNTER — Encounter: Payer: Self-pay | Admitting: Family Medicine

## 2020-05-05 VITALS — BP 138/84 | HR 70 | Temp 98.7°F | Wt 238.0 lb

## 2020-05-05 DIAGNOSIS — S060X0A Concussion without loss of consciousness, initial encounter: Secondary | ICD-10-CM | POA: Diagnosis not present
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
## 2020-08-19 ENCOUNTER — Encounter: Payer: Self-pay | Admitting: Nurse Practitioner

## 2020-08-19 ENCOUNTER — Other Ambulatory Visit: Payer: Self-pay

## 2020-08-19 ENCOUNTER — Telehealth (INDEPENDENT_AMBULATORY_CARE_PROVIDER_SITE_OTHER): Payer: Medicare Other | Admitting: Nurse Practitioner

## 2020-08-19 DIAGNOSIS — R7303 Prediabetes: Secondary | ICD-10-CM | POA: Insufficient documentation

## 2020-08-19 DIAGNOSIS — E7800 Pure hypercholesterolemia, unspecified: Secondary | ICD-10-CM | POA: Insufficient documentation

## 2020-08-19 DIAGNOSIS — Z0001 Encounter for general adult medical examination with abnormal findings: Secondary | ICD-10-CM

## 2020-08-19 DIAGNOSIS — I10 Essential (primary) hypertension: Secondary | ICD-10-CM

## 2020-08-19 DIAGNOSIS — N529 Male erectile dysfunction, unspecified: Secondary | ICD-10-CM

## 2020-08-19 DIAGNOSIS — Z0000 Encounter for general adult medical examination without abnormal findings: Secondary | ICD-10-CM

## 2020-08-19 NOTE — Progress Notes (Signed)
Patient: Ricky Lowe, Male    DOB: 11/11/1950, 70 y.o.   MRN: [id number]  Visit Date: 08/19/2020  Today's Provider: Eulogio Bear, NP   Chief Complaint  Patient presents with  . Medicare Wellness    Subjective:   Ricky Lowe is a 70 y.o. male who presents today for his Subsequent Annual Wellness Visit.  Care Team: Primary care - [facility] - [place] [facility] - Baruch Goldmann, [facility] - Manus Rudd   HPI   Hypertension - Currently taking lisinopril 40 mg daily; tolerating well. BP at home normally 135/80.  Hyperlipidemia - Currently taking rosuvastatin 5 mg daily.  Tolerating well without issue.    Prediabetes - Not checking blood sugar or taking medication at present time.  Due for recheck.  Review of Systems  Constitutional: Negative.   HENT: Negative.   Eyes: Negative.   Respiratory: Negative.   Cardiovascular: Negative.   Gastrointestinal: Negative.   Endocrine: Negative.   Genitourinary: Negative.   Musculoskeletal: Negative.   Neurological: Negative.   Hematological: Negative.   Psychiatric/Behavioral: Negative.     Past Medical History:  Diagnosis Date  . Arthritis   . Cataract   . Colon polyps    Dr. Sunny Schlein  . Colon polyps   . Complication of anesthesia   . Dyslipidemia   . Hypertension   . PONV (postoperative nausea and vomiting)   . Prediabetes     Past Surgical History:  Procedure Laterality Date  . CATARACT EXTRACTION W/PHACO Left 12/21/2016   Procedure: CATARACT EXTRACTION PHACO AND INTRAOCULAR LENS PLACEMENT (IOC);  Surgeon: Baruch Goldmann, MD;  Location: [facility];  Service: Ophthalmology;  Laterality: Left;  CDE: 2.63  . CATARACT EXTRACTION W/PHACO Right 01/18/2017   Procedure: CATARACT EXTRACTION PHACO AND INTRAOCULAR LENS PLACEMENT RIGHT EYE;  Surgeon: Baruch Goldmann, MD;  [facility]: [facility];  Service: Ophthalmology;  Laterality: Right;  CDE: 4.88  . COLONOSCOPY N/A 05/06/2019    Procedure: COLONOSCOPY;  Surgeon: Daneil Dolin, MD;  [facility];  Service: Endoscopy;  Laterality: N/A;  10:30  . JOINT REPLACEMENT  2010 / 2012   rt hip / lt hip  . POLYPECTOMY  05/06/2019   Procedure: POLYPECTOMY;  Surgeon: Daneil Dolin, MD;  [facility];  Service: Endoscopy;;    Family History  Problem Relation Age of Onset  . Diabetes Mother   . Hearing loss Mother   . Heart disease Mother   . Hyperlipidemia Mother   . Stroke Mother   . Vision loss Mother   . Hypertension Father   . Arthritis Maternal Grandmother     Social History   Socioeconomic History  . Marital status: Married    Spouse name: Not on file  . Number of children: Not on file  . Years of education: Not on file  . Highest education level: Not on file  Occupational History  . Not on file  Tobacco Use  . Smoking status: Never Smoker  . Smokeless tobacco: Former Network engineer  . Vaping Use: Never used  Substance and Sexual Activity  . Alcohol use: Yes  . Drug use: No  . Sexual activity: Yes    Birth control/protection: None  Other Topics Concern  . Not on file  Social History Narrative  . Not on file   Social Determinants of Health   Financial Resource Strain: Not on file  Food Insecurity: Not on file  Transportation Needs: Not on file  Physical  Activity: Not on file  Stress: Not on file  Social Connections: Not on file  Intimate Partner Violence: Not on file    Outpatient Encounter Medications as of 08/19/2020  Medication Sig  . aspirin 81 MG tablet Take 81 mg by mouth daily.  Marland Kitchen lisinopril (ZESTRIL) 40 MG tablet TAKE 1 TABLET(40 MG) BY MOUTH DAILY  . rosuvastatin (CRESTOR) 5 MG tablet TAKE 1 TABLET(5 MG) BY MOUTH DAILY  . [DISCONTINUED] doxycycline (VIBRA-TABS) 100 MG tablet Take 1 tablet (100 mg total) by mouth 2 (two) times daily. (Patient not taking: Reported on 05/05/2020)   No facility-administered encounter medications on file as of 08/19/2020.    Functional Status Survey: Is the patient deaf or have difficulty hearing?: No Does the patient have difficulty seeing, even when wearing glasses/contacts?: Yes (follow with eye dr) Does the patient have difficulty concentrating, remembering, or making decisions?: No Does the patient have difficulty walking or climbing stairs?: No Does the patient have difficulty dressing or bathing?: No  Fall Risk Assessment Fall Risk  08/19/2020 12/25/2018 08/08/2016  Falls in the past year? 0 0 No  Number falls in past yr: 0 - -  Injury with Fall? 0 - -  Follow up - Falls evaluation completed -   Depression Screen Depression screen [facility] 2/9 08/19/2020 12/25/2018 04/12/2017 08/08/2016  Decreased Interest 0 0 0 0  Down, Depressed, Hopeless 0 0 0 0  PHQ - 2 Score 0 0 0 0  Altered sleeping - - - 0  Tired, decreased energy - - - 0  Change in appetite - - - 0  Feeling bad or failure about yourself  - - - 0  Trouble concentrating - - - 0  Moving slowly or fidgety/restless - - - 0  Suicidal thoughts - - - 0  PHQ-9 Score - - - 0  Difficult doing work/chores - - - Not difficult at all   6CIT Screen 08/19/2020  What Year? 0 points  What month? 0 points  What time? 0 points  Count back from 20 0 points  Months in reverse 0 points  Repeat phrase 0 points  Total Score 0    Advanced Directives Does patient have a HCPOA?    no If yes, name and contact information: wife; Does patient have a living will or MOST form?  no  Objective:   Vitals: There were no vitals taken for this visit. There is no height or weight on file to calculate BMI. No exam data present  Physical Exam Neurological:     Mental Status: He is oriented to person, place, and time.  Psychiatric:        Mood and Affect: Mood normal.        Behavior: Behavior normal.        Thought Content: Thought content normal.        Judgment: Judgment normal.     Assessment & Plan:     Annual Wellness Visit  Continue medications as  prescribed for now.  Will have patient schedule follow up with PCP for labs with medication refills.  Will send advanced directives in mail.  Reviewed patient's Family Medical History  Reviewed and updated list of patient's medical providers  Assessment of cognitive impairment was done  Assessed patient's functional ability Established a written schedule for health screening [place] Completed and Reviewed  Immunization History  Administered Date(s) Administered  . Fluad Quad(high Dose 65+) 12/25/2018  . Hepatitis A, Adult 09/02/2017  . Influenza,inj,Quad  PF,6+ Mos 01/27/2016, 02/10/2017  . Influenza-Unspecified 02/10/2017  . PFIZER(Purple Top)SARS-COV-2 Vaccination 06/08/2019  . Pneumococcal Conjugate-13 12/25/2018  . Tdap 04/16/2013, 09/02/2017    Health Maintenance  Topic Date Due  . COVID-19 Vaccine (2 - Pfizer 3-dose series) 06/29/2019  . PNA vac Low Risk Adult (2 of 2 - PPSV23) 12/25/2019  . INFLUENZA VACCINE  11/14/2020  . COLONOSCOPY (Pts 45-36yrs Insurance coverage will need to be confirmed)  05/05/2024  . TETANUS/TDAP  09/03/2027  . Hepatitis C Screening  Completed  . HPV VACCINES  Aged Out    Discussed health benefits of physical activity, and encouraged him to engage in regular exercise appropriate for his age and condition.   No orders of the defined types were placed in this encounter.   Current Outpatient Medications:  .  aspirin 81 MG tablet, Take 81 mg by mouth daily., Disp: , Rfl:  .  lisinopril (ZESTRIL) 40 MG tablet, TAKE 1 TABLET(40 MG) BY MOUTH DAILY, Disp: 90 tablet, Rfl: 3 .  rosuvastatin (CRESTOR) 5 MG tablet, TAKE 1 TABLET(5 MG) BY MOUTH DAILY, Disp: 90 tablet, Rfl: 3 Medications Discontinued During This Encounter  Medication Reason  . doxycycline (VIBRA-TABS) 100 MG tablet Completed Course    Next Medicare Wellness Visit in 12+ months  This visit was completed via telephone due to the restrictions of the COVID-19  pandemic. All issues as above were discussed and addressed but no physical exam was performed. If it was felt that the patient should be evaluated in the office, they were directed there. The patient verbally consented to this visit. Patient was unable to complete an audio/visual visit due to Lack of equipment. . Location of the patient: home . Location of the provider: work . Those involved with this call:  . Provider: Noemi Chapel, DNP, FNP-C . CMA: Annabelle Harman, CMA . Front Desk/Registration: Vevelyn Pat  . Time spent on call: 8 minutes on the phone discussing health concerns. 12 minutes total spent in review of patient's record and preparation of their chart.  I verified patient identity using two factors (patient name and date of birth). Patient consents verbally to being seen via telemedicine visit today.

## 2020-12-06 ENCOUNTER — Encounter (INDEPENDENT_AMBULATORY_CARE_PROVIDER_SITE_OTHER): Payer: Medicare Other | Admitting: Ophthalmology

## 2020-12-06 ENCOUNTER — Other Ambulatory Visit: Payer: Self-pay

## 2020-12-06 DIAGNOSIS — H43813 Vitreous degeneration, bilateral: Secondary | ICD-10-CM

## 2020-12-06 DIAGNOSIS — I10 Essential (primary) hypertension: Secondary | ICD-10-CM | POA: Diagnosis not present

## 2020-12-06 DIAGNOSIS — H35371 Puckering of macula, right eye: Secondary | ICD-10-CM | POA: Diagnosis not present

## 2020-12-06 DIAGNOSIS — H35033 Hypertensive retinopathy, bilateral: Secondary | ICD-10-CM | POA: Diagnosis not present

## 2020-12-06 DIAGNOSIS — D3131 Benign neoplasm of right choroid: Secondary | ICD-10-CM

## 2020-12-08 ENCOUNTER — Other Ambulatory Visit: Payer: Self-pay

## 2020-12-08 ENCOUNTER — Other Ambulatory Visit: Payer: Medicare Other

## 2020-12-08 DIAGNOSIS — E7800 Pure hypercholesterolemia, unspecified: Secondary | ICD-10-CM

## 2020-12-08 DIAGNOSIS — R7303 Prediabetes: Secondary | ICD-10-CM

## 2020-12-08 DIAGNOSIS — I10 Essential (primary) hypertension: Secondary | ICD-10-CM

## 2020-12-09 LAB — COMPLETE METABOLIC PANEL WITH GFR
AG Ratio: 2 (calc) (ref 1.0–2.5)
ALT: 36 U/L (ref 9–46)
AST: 24 U/L (ref 10–35)
Albumin: 4.3 g/dL (ref 3.6–5.1)
Alkaline phosphatase (APISO): 36 U/L (ref 35–144)
BUN: 14 mg/dL (ref 7–25)
CO2: 29 mmol/L (ref 20–32)
Calcium: 9.6 mg/dL (ref 8.6–10.3)
Chloride: 104 mmol/L (ref 98–110)
Creat: 0.94 mg/dL (ref 0.70–1.35)
Globulin: 2.1 g/dL (calc) (ref 1.9–3.7)
Glucose, Bld: 160 mg/dL — ABNORMAL HIGH (ref 65–99)
Potassium: 4.8 mmol/L (ref 3.5–5.3)
Sodium: 139 mmol/L (ref 135–146)
Total Bilirubin: 0.5 mg/dL (ref 0.2–1.2)
Total Protein: 6.4 g/dL (ref 6.1–8.1)
eGFR: 89 mL/min/{1.73_m2} (ref >=60)

## 2020-12-09 LAB — CBC WITH DIFFERENTIAL/PLATELET
Absolute Monocytes: 392 cells/uL (ref 200–950)
Basophils Absolute: 39 cells/uL (ref 0–200)
Basophils Relative: 0.8 %
Eosinophils Absolute: 152 cells/uL (ref 15–500)
Eosinophils Relative: 3.1 %
HCT: 42.9 % (ref 38.5–50.0)
Hemoglobin: 14.7 g/dL (ref 13.2–17.1)
Lymphs Abs: 1901 cells/uL (ref 850–3900)
MCH: 30.2 pg (ref 27.0–33.0)
MCHC: 34.3 g/dL (ref 32.0–36.0)
MCV: 88.3 fL (ref 80.0–100.0)
MPV: 10.6 fL (ref 7.5–12.5)
Monocytes Relative: 8 %
Neutro Abs: 2416 cells/uL (ref 1500–7800)
Neutrophils Relative %: 49.3 %
Platelets: 178 10*3/uL (ref 140–400)
RBC: 4.86 10*6/uL (ref 4.20–5.80)
RDW: 12.5 % (ref 11.0–15.0)
Total Lymphocyte: 38.8 %
WBC: 4.9 10*3/uL (ref 3.8–10.8)

## 2020-12-09 LAB — LIPID PANEL
Cholesterol: 137 mg/dL (ref <200)
HDL: 39 mg/dL — ABNORMAL LOW (ref >=40)
LDL Cholesterol (Calc): 78 mg/dL (calc)
Non-HDL Cholesterol (Calc): 98 mg/dL (calc) (ref <130)
Total CHOL/HDL Ratio: 3.5 (calc) (ref <5.0)
Triglycerides: 114 mg/dL (ref <150)

## 2020-12-09 LAB — HEMOGLOBIN A1C
Hgb A1c MFr Bld: 7 % of total Hgb — ABNORMAL HIGH (ref <5.7)
Mean Plasma Glucose: 154 mg/dL
eAG (mmol/L): 8.5 mmol/L

## 2020-12-13 ENCOUNTER — Encounter: Payer: Self-pay | Admitting: Family Medicine

## 2020-12-13 ENCOUNTER — Ambulatory Visit (INDEPENDENT_AMBULATORY_CARE_PROVIDER_SITE_OTHER): Payer: Medicare Other | Admitting: Family Medicine

## 2020-12-13 ENCOUNTER — Other Ambulatory Visit: Payer: Self-pay

## 2020-12-13 VITALS — BP 134/72 | HR 70 | Temp 98.2°F | Resp 14 | Ht 72.0 in | Wt 233.0 lb

## 2020-12-13 DIAGNOSIS — E7800 Pure hypercholesterolemia, unspecified: Secondary | ICD-10-CM

## 2020-12-13 DIAGNOSIS — Z23 Encounter for immunization: Secondary | ICD-10-CM

## 2020-12-13 DIAGNOSIS — E118 Type 2 diabetes mellitus with unspecified complications: Secondary | ICD-10-CM

## 2020-12-13 DIAGNOSIS — I10 Essential (primary) hypertension: Secondary | ICD-10-CM | POA: Diagnosis not present

## 2020-12-13 MED ORDER — METFORMIN HCL ER 500 MG PO TB24: 1000.0000 mg | ORAL_TABLET | Freq: Every day | ORAL | 3 refills | Status: DC

## 2020-12-13 MED ORDER — ROSUVASTATIN CALCIUM 5 MG PO TABS
ORAL_TABLET | ORAL | 3 refills | Status: DC
Start: 2020-12-13 — End: 2021-12-15

## 2020-12-13 MED ORDER — LISINOPRIL 40 MG PO TABS: 40.0000 mg | ORAL_TABLET | Freq: Every day | ORAL | 3 refills | Status: DC

## 2020-12-13 NOTE — Addendum Note (Signed)
Addended by: Sheral Flow on: 12/13/2020 10:02 AM   Modules accepted: Orders

## 2020-12-13 NOTE — Progress Notes (Signed)
Subjective:    Patient ID: Ricky Lowe, male    DOB: 07/22/1950, 70 y.o.   MRN: [id number]  HPI Patient is a very pleasant 70 year old Caucasian gentleman here today for a follow-up of his lab work.  Please see below.  His A1c has increased from 6.3-7.0.  He admits that he is eating bread occasionally.  He does not drink any sodas or sweet tea.  He tries to stay relatively active however he admits that he could be more conscious about what he is eating.  He admits that recently he has gained some weight due to caring for his brother who had cancer.  His diet as a result suffered.  He denies any chest pain or shortness of breath or dyspnea on exertion.  His blood pressure today is excellent.  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  He states that his skin on the plantar surfaces of his MTP joints feels "thick".  However he denies any burning or staining paresthesias.  He has excellent pulses checked in both feet 2/4 at the dorsalis pedis and posterior tibialis.  He denies any myalgias or right upper quadrant pain.  He is due for a flu shot as well as Pneumovax 23. Past Medical History:  Diagnosis Date   Arthritis    Cataract    Colon polyps    Dr. Sunny Schlein   Colon polyps    Complication of anesthesia    Dyslipidemia    Hypertension    PONV (postoperative nausea and vomiting)    Prediabetes    Past Surgical History:  Procedure Laterality Date   CATARACT EXTRACTION W/PHACO Left 12/21/2016   Procedure: CATARACT EXTRACTION PHACO AND INTRAOCULAR LENS PLACEMENT ([place]);  Surgeon: Baruch Goldmann, MD;  Location: [facility];  Service: Ophthalmology;  Laterality: Left;  CDE: 2.63   CATARACT EXTRACTION W/PHACO Right 01/18/2017   Procedure: CATARACT EXTRACTION PHACO AND INTRAOCULAR LENS PLACEMENT RIGHT EYE;  Surgeon: Baruch Goldmann, MD;  Location: [facility];  Service: Ophthalmology;  Laterality: Right;  CDE: 4.88   COLONOSCOPY N/A 05/06/2019   Procedure: COLONOSCOPY;  Surgeon: Daneil Dolin, MD;   [facility];  Service: Endoscopy;  Laterality: N/A;  10:30   JOINT REPLACEMENT  2010 / 2012   rt hip / lt hip   POLYPECTOMY  05/06/2019   Procedure: POLYPECTOMY;  Surgeon: Daneil Dolin, MD;  [facility];  Service: Endoscopy;;   Current Outpatient Medications on File Prior to Visit  Medication Sig Dispense Refill   aspirin 81 MG tablet Take 81 mg by mouth daily.     No current facility-administered medications on file prior to visit.   No Known Allergies Social History   Socioeconomic History   Marital status: Married    Spouse name: Not on file   Number of children: Not on file   Years of education: Not on file   Highest education level: Not on file  Occupational History   Not on file  Tobacco Use   Smoking status: Never   Smokeless tobacco: Former    Quit date: 07/19/1989  Vaping Use   Vaping Use: Never used  Substance and Sexual Activity   Alcohol use: Yes   Drug use: No   Sexual activity: Yes    Birth control/protection: None  Other Topics Concern   Not on file  Social History Narrative   Not on file   Social Determinants of Health   Financial Resource Strain: Not on file  Food  Insecurity: Not on file  Transportation Needs: Not on file  Physical Activity: Not on file  Stress: Not on file  Social Connections: Not on file  Intimate Partner Violence: Not on file   Family History  Problem Relation Age of Onset   Diabetes Mother    Hearing loss Mother    Heart disease Mother    Hyperlipidemia Mother    Stroke Mother    Vision loss Mother    Hypertension Father    Arthritis Maternal Grandmother       Review of Systems  All other systems reviewed and are negative.     Objective:   Physical Exam Vitals reviewed.  Constitutional:      General: He is not in acute distress.    Appearance: Normal appearance. He is well-developed. He is obese. He is not ill-appearing, toxic-appearing or diaphoretic.  HENT:     Head: Normocephalic  and atraumatic.  Eyes:     General: No visual field deficit or scleral icterus. Neck:     Thyroid: No thyromegaly.     Vascular: No JVD.     Trachea: No tracheal deviation.  Cardiovascular:     Rate and Rhythm: Normal rate and regular rhythm.     Heart sounds: Normal heart sounds. No murmur heard.   No friction rub. No gallop.  Pulmonary:     Effort: Pulmonary effort is normal. No respiratory distress.     Breath sounds: Normal breath sounds. No stridor. No wheezing or rales.  Chest:     Chest wall: No tenderness.  Abdominal:     General: Bowel sounds are normal.     Palpations: Abdomen is soft.  Musculoskeletal:        General: No tenderness.     Right lower leg: No edema.     Left lower leg: No edema.  Neurological:     General: No focal deficit present.     Mental Status: He is alert and oriented to person, place, and time. Mental status is at baseline.     Cranial Nerves: Cranial nerves are intact. No cranial nerve deficit, dysarthria or facial asymmetry.     Sensory: Sensation is intact.     Motor: Motor function is intact. No tremor or abnormal muscle tone.     Coordination: Coordination is intact. Romberg sign negative. Finger-Nose-Finger Test and Heel to [facility] Test normal. Rapid alternating movements normal.     Gait: Gait is intact.     Deep Tendon Reflexes: Reflexes are normal and symmetric.  Psychiatric:        Behavior: Behavior normal.        Thought Content: Thought content normal.        Judgment: Judgment normal.          Assessment & Plan:  Pure hypercholesterolemia  Benign essential HTN - Plan: lisinopril (ZESTRIL) 40 MG tablet  Controlled type 2 diabetes mellitus with complication, without long-term current use of insulin ([place]) Patient's blood pressure today is outstanding.  I am very happy with his blood pressure.  I am also happy with the cholesterol and the improvement that we have seen in his cholesterol.  We will continue lisinopril 40 mg a day  and rosuvastatin at his current dose.  However the patient's A1c has increased to 7.0 and does qualify as diabetes.  We discussed options for management including aggressive lifestyle changes and/or medication.  The patient elects to try metformin extended release 1000 mg p.o. every morning and then being  more conscious about his diet and we will recheck in 3 months.  He received Pneumovax 23 today as well as his flu shot

## 2020-12-16 IMAGING — MR MR HEAD W/O CM
11 of 12 series · 41 of 48 positions shown · non-contrast
Comparison: Head CT earlier same day

CLINICAL DATA: Dizziness.  Weakness.

EXAM:
MRI HEAD WITHOUT CONTRAST
TECHNIQUE: Multiplanar, multiecho pulse sequences of the brain and surrounding
structures were obtained without intravenous contrast.

[Series 5: DWI · axial · 4.0mm · 0.88mm/px · z∈[-85,+54]mm · 5 of 36 slices shown (1 of 6)]
[im 1/36]
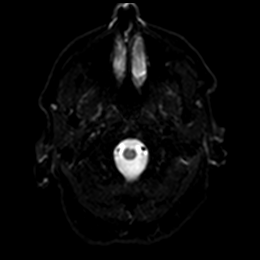
[im 9/36]
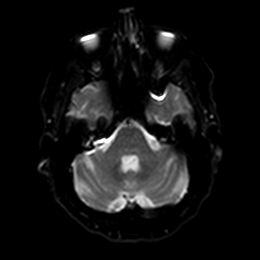
[im 18/36]
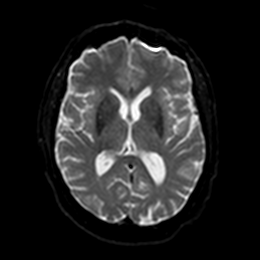
[im 27/36]
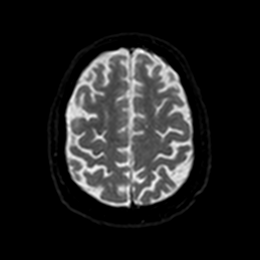
[im 36/36]
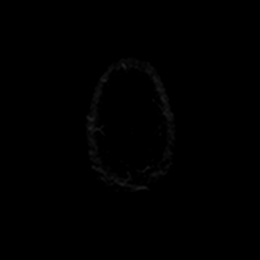

[Series 5: DWI · axial · 4.0mm · 0.88mm/px · z∈[-85,+54]mm · 4 of 36 slices shown (2 of 6)]
[im 1/36]
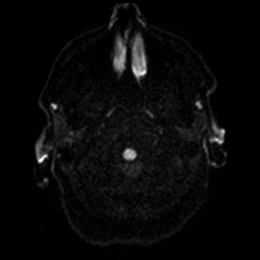
[im 12/36]
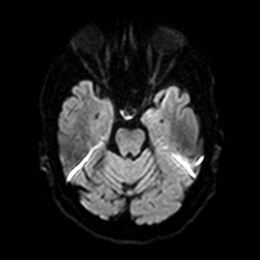
[im 24/36]
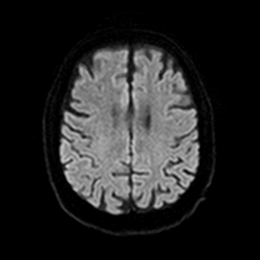
[im 36/36]
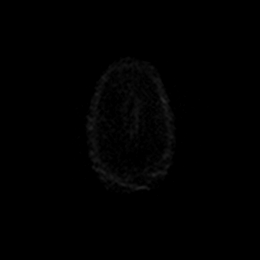

[Series 6: DWI · axial · 4.0mm · 0.88mm/px · z∈[-85,+54]mm · 4 of 36 slices shown (3 of 6)]
[im 1/36]
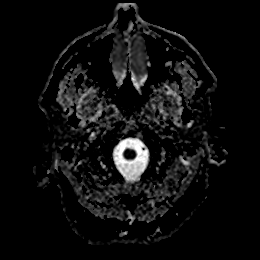
[im 12/36]
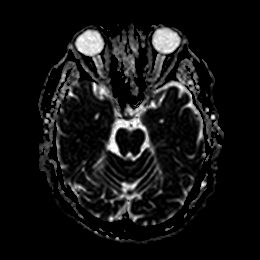
[im 24/36]
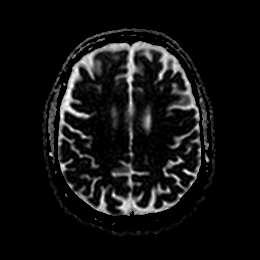
[im 36/36]
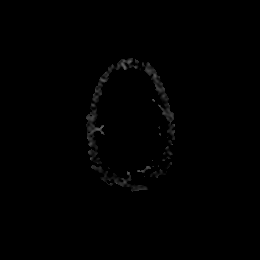

[Series 7: DWI · coronal · 4.0mm · 0.88mm/px · 4 of 32 slices shown (4 of 6)]
[im 1/32]
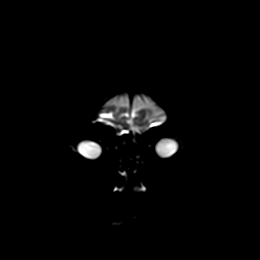
[im 11/32]
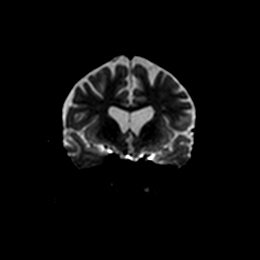
[im 21/32]
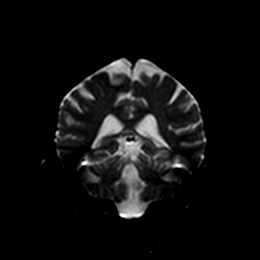
[im 32/32]
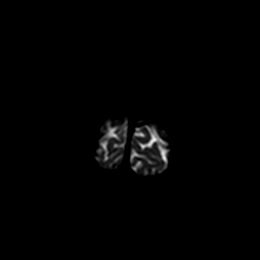

[Series 7: DWI · coronal · 4.0mm · 0.88mm/px · 4 of 32 slices shown (5 of 6)]
[im 1/32]
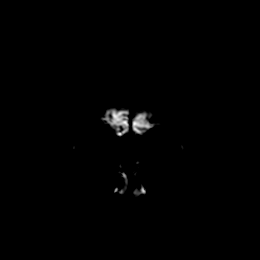
[im 11/32]
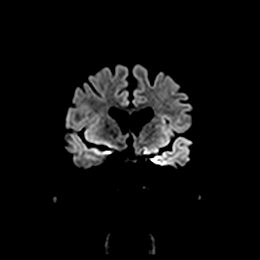
[im 21/32]
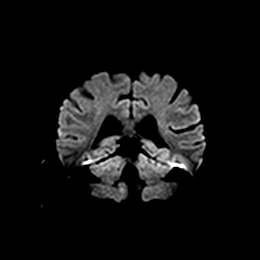
[im 32/32]
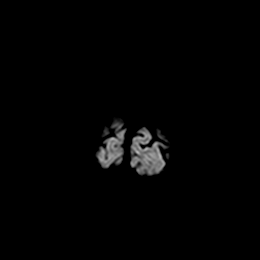

[Series 8: DWI · coronal · 4.0mm · 0.88mm/px · 4 of 32 slices shown (6 of 6)]
[im 1/32]
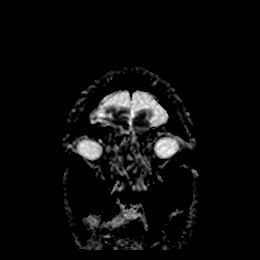
[im 11/32]
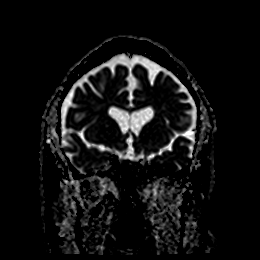
[im 21/32]
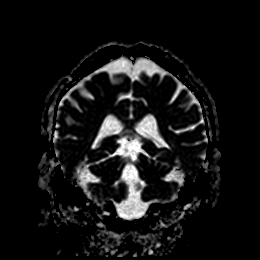
[im 32/32]
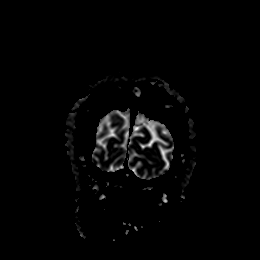

[Series 9: T1 · sagittal · 5.0mm · 0.94mm/px · 3 of 25 slices shown]
[im 1/25]
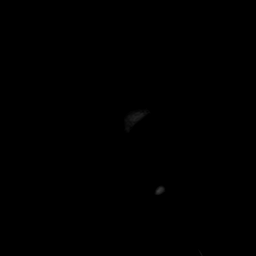
[im 13/25]
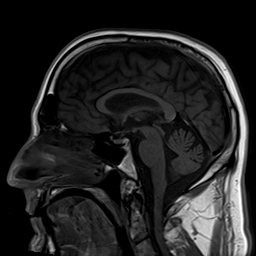
[im 25/25]
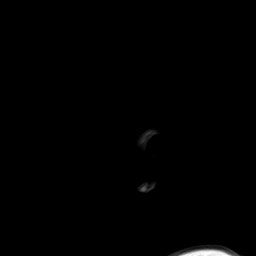

[Series 10: T2 · axial · 5.0mm · 0.72mm/px · z∈[-87,+56]mm · 3 of 25 slices shown (1 of 2)]
[im 1/25]
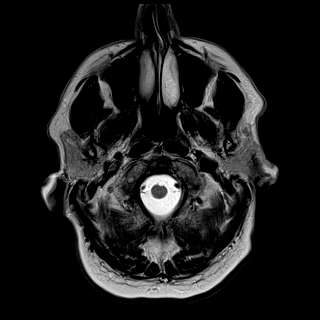
[im 13/25]
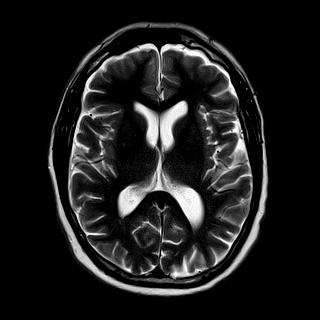
[im 25/25]
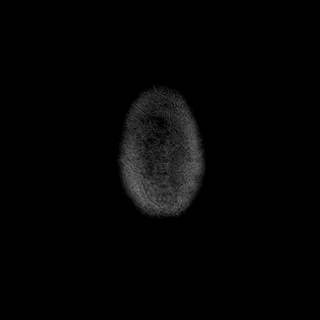

[Series 11: ax hemo · axial · 5.0mm · 0.86mm/px · z∈[-87,+57]mm · 3 of 25 slices shown]
[im 1/25]
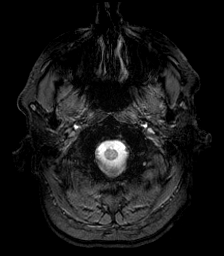
[im 13/25]
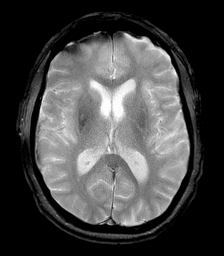
[im 25/25]
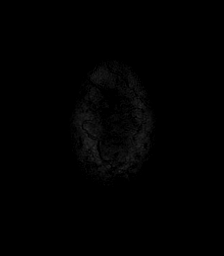

[Series 12: FLAIR · axial · 4.0mm · 0.43mm/px · z∈[-70,+61]mm · 4 of 34 slices shown]
[im 1/34]
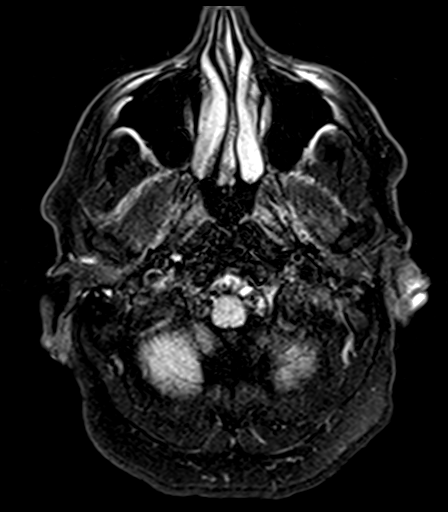
[im 12/34]
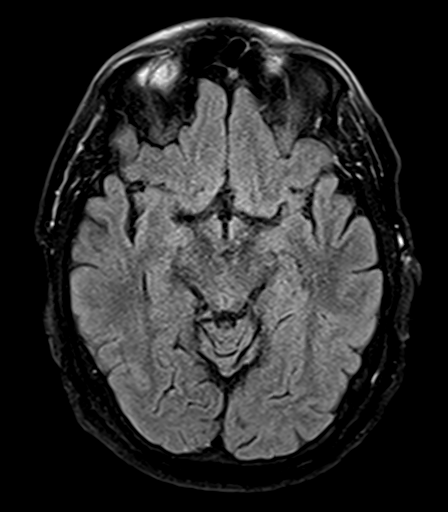
[im 23/34]
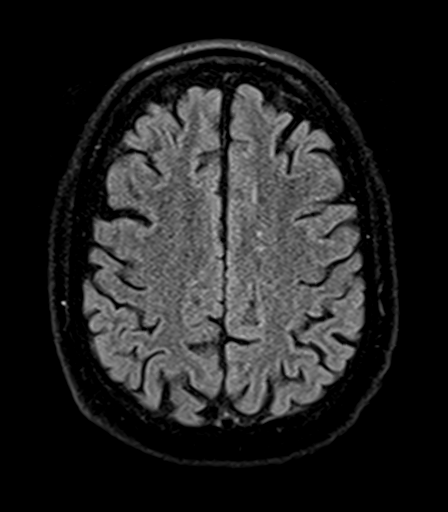
[im 34/34]
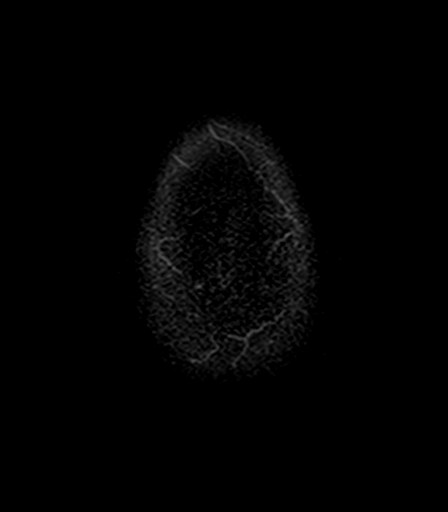

[Series 14: T2 · coronal · 5.0mm · 0.72mm/px · 3 of 28 slices shown (2 of 2)]
[im 1/28]
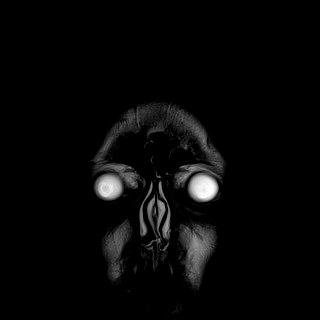
[im 14/28]
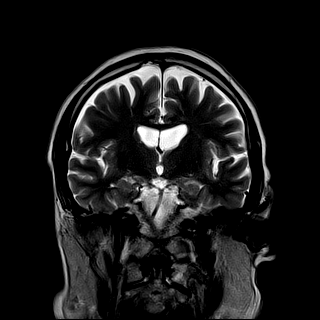
[im 28/28]
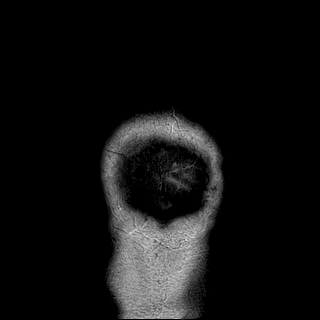

[41 of 48 positions shown; findings below may reference images not displayed]

FINDINGS: Brain: Diffusion imaging does not show any acute or subacute
infarction. Minimal small vessel changes present of the dorsal pons.
Few punctate foci of T2 and FLAIR signal in the hemispheric white
matter. No cortical or large vessel territory infarction. No mass
lesion, hemorrhage, hydrocephalus or extra-axial collection.

Vascular: Major vessels at the base of the brain show flow.

Skull and upper cervical spine: Negative

Sinuses/Orbits: Clear/normal

Other: Mastoids are clear.
IMPRESSION: No abnormality seen to explain acute dizziness. Mild chronic
small-vessel change of the pons and cerebral hemispheric white
matter.

## 2021-03-17 ENCOUNTER — Other Ambulatory Visit: Payer: Self-pay

## 2021-03-17 DIAGNOSIS — E118 Type 2 diabetes mellitus with unspecified complications: Secondary | ICD-10-CM

## 2021-03-22 ENCOUNTER — Other Ambulatory Visit: Payer: Medicare Other

## 2021-03-22 ENCOUNTER — Other Ambulatory Visit: Payer: Self-pay

## 2021-03-22 DIAGNOSIS — E118 Type 2 diabetes mellitus with unspecified complications: Secondary | ICD-10-CM

## 2021-03-23 LAB — HEMOGLOBIN A1C
Hgb A1c MFr Bld: 6.9 % of total Hgb — ABNORMAL HIGH (ref <5.7)
Mean Plasma Glucose: 151 mg/dL
eAG (mmol/L): 8.4 mmol/L

## 2021-08-10 ENCOUNTER — Telehealth: Payer: Self-pay | Admitting: *Deleted

## 2021-08-10 NOTE — Chronic Care Management (AMB) (Signed)
?  Care Management  ? ?Note ? ?08/10/2021 ?Name: [name] MRN: [id number] DOB: Jun 21, 1950 ? ?Ricky Lowe is a 71 y.o. year old male who is a primary care patient of Pickard, [name], MD. I reached out to Marshia Ly by phone today offer care coordination services.  ? ?[name] was given information about care management services today including:  ?Care management services include personalized support from designated clinical staff supervised by his physician, including individualized plan of care and coordination with other care providers ?24/7 contact phone numbers for assistance for urgent and routine care needs. ?The patient may stop care management services at any time by phone call to the office staff. ? ?Patient agreed to services and verbal consent obtained.  ? ?Follow up plan: ?Telephone appointment with care management team member scheduled for:08/11/21 ? ?Laverda Sorenson  ?Care Guide, Embedded Care Coordination ?[facility] [place]  Care Management  ?Direct Dial: [phone] ? ?

## 2021-08-11 ENCOUNTER — Ambulatory Visit (INDEPENDENT_AMBULATORY_CARE_PROVIDER_SITE_OTHER): Payer: Medicare Other | Admitting: *Deleted

## 2021-08-11 NOTE — Patient Instructions (Signed)
Visit Information ? ?Thank you for taking time to visit with me today. Please don't hesitate to contact me if I can be of assistance to you before our next scheduled telephone appointment. ? ?Following are the goals we discussed today:  ?Take medications as prescribed   ?Attend all scheduled provider appointments ?Call pharmacy for medication refills 3-7 days in advance of running out of medications ?Attend church or other social activities ?Perform all self care activities independently  ?Perform IADL's (shopping, preparing meals, housekeeping, managing finances) independently ?Call provider office for new concerns or questions  ?check blood pressure weekly ?choose a place to take my blood pressure (home, clinic or office, retail store) ?write blood pressure results in a log or diary ?keep a blood pressure log ?take blood pressure log to all doctor appointments ?keep all doctor appointments ?take medications for blood pressure exactly as prescribed ?eat more whole grains, fruits and vegetables, lean meats and healthy fats ?call for medicine refill 2 or 3 days before it runs out ?take all medications exactly as prescribed ?call doctor with any symptoms you believe are related to your medicine ?call doctor when you experience any new symptoms ?go to all doctor appointments as scheduled ?adhere to prescribed diet: heart healthy, low sodium ?Look over education sent via My Chart- low sodium and heart healthy diet ? ?Our next appointment is by telephone on 11/03/21 at 130 pm ? ?Please call the care guide team at [phone] if you need to cancel or reschedule your appointment.  ? ?If you are experiencing a Mental Health or [place] or need someone to talk to, please call the Suicide and Crisis Lifeline: 988 ?call the [place] National Suicide Prevention Lifeline: (684)331-4813 or TTY: (647)164-3136 TTY 775-836-1411) to talk to a trained counselor ?call 1-800-273-TALK (toll free, 24 hour hotline) ?go to  [facility] [address] [place] [phone]) ?call the [facility]: [phone] ?call 911  ? ?Following is a copy of your full plan of care:  ?Care Plan : [place] of Care  ?Updates made by Kassie Mends, RN since 08/11/2021 12:00 AM  ?  ? ?Problem: No plan of care established for management of chronic disease state  (HTN, hypercholesteremia)   ?Priority: High  ?  ? ?Long-Range Goal: Development of plan of care for chronic disease management (HTN, hypercholesteremia)   ?Start Date: 08/11/2021  ?Expected End Date: 02/07/2022  ?Priority: High  ?Note:   ?Current Barriers:  ?Knowledge Deficits related to plan of care for management of HTN and HLD  ?Patient reports he lives with spouse, is independent in all aspects of his care, continues to drive, has pre-diabetes but has not been instructed to check CBG, checks blood pressure on occasion with "good readings", gets out and walks some for exercise, reports has all medications and taking as prescribed. ? ?RNCM Clinical Goal(s):  ?Patient will verbalize understanding of plan for management of HTN and HLD as evidenced by patient report, review of EHR and  through collaboration with RN Care manager, provider, and care team.  ? ?Interventions: ?1:1 collaboration with primary care provider regarding development and update of comprehensive plan of care as evidenced by provider attestation and co-signature ?Inter-disciplinary care team collaboration (see longitudinal plan of care) ?Evaluation of current treatment plan related to  self management and patient's adherence to plan as established by provider ? ? ?Hyperlipidemia:  (Status: New goal. Goal on Track (progressing): YES.) Long Term Goal  ?Lab Results  ?  Component Value Date  ? CHOL 137 12/08/2020  ? HDL 39 (L) 12/08/2020  ? [place] 78 12/08/2020  ? TRIG 114 12/08/2020  ? CHOLHDL 3.5 12/08/2020  ?Medication review performed; medication list updated  in electronic medical record.  ?Provider established cholesterol goals reviewed; ?Counseled on importance of regular laboratory monitoring as prescribed; ?Provided HLD educational materials; ?Reviewed role and benefits of statin for ASCVD risk reduction; ?Reviewed importance of limiting foods high in cholesterol; ?Reviewed exercise goals and target of 150 minutes per week; ?Screening for signs and symptoms of depression related to chronic disease state;  ?Assessed social determinant of health barriers;  ?Education sent via My Chart- heart healthy diet ? ?Hypertension Interventions:  (Status:  New goal. and Goal on track:  Yes.) Long Term Goal ?Last practice recorded BP readings:  ?BP Readings from Last 3 Encounters:  ?12/13/20 134/72  ?05/05/20 138/84  ?12/09/19 131/63  ?Most recent eGFR/CrCl:  ?Lab Results  ?Component Value Date  ? EGFR 89 12/08/2020  ?  No components found for: CRCL ? ?Evaluation of current treatment plan related to hypertension self management and patient's adherence to plan as established by provider ?Reviewed medications with patient and discussed importance of compliance ?Counseled on the importance of exercise goals with target of 150 minutes per week ?Discussed plans with patient for ongoing care management follow up and provided patient with direct contact information for care management team ?Advised patient, providing education and rationale, to monitor blood pressure daily and record, calling PCP for findings outside established parameters ?Discussed complications of poorly controlled blood pressure such as heart disease, stroke, circulatory complications, vision complications, kidney impairment, sexual dysfunction  ?Reviewed importance of adherence to low sodium diet ?Education sent via My Chart- low sodium diet ? ?Patient Goals/Self-Care Activities: ?Take medications as prescribed   ?Attend all scheduled provider appointments ?Call pharmacy for medication refills 3-7 days in advance of  running out of medications ?Attend church or other social activities ?Perform all self care activities independently  ?Perform IADL's (shopping, preparing meals, housekeeping, managing finances) independently ?Call provider office for new concerns or questions  ?check blood pressure weekly ?choose a place to take my blood pressure (home, clinic or office, retail store) ?write blood pressure results in a log or diary ?keep a blood pressure log ?take blood pressure log to all doctor appointments ?keep all doctor appointments ?take medications for blood pressure exactly as prescribed ?eat more whole grains, fruits and vegetables, lean meats and healthy fats ?- call for medicine refill 2 or 3 days before it runs out ?- take all medications exactly as prescribed ?- call doctor with any symptoms you believe are related to your medicine ?- call doctor when you experience any new symptoms ?- go to all doctor appointments as scheduled ?- adhere to prescribed diet: heart healthy, low sodium ?Look over education sent via My Chart- low sodium and heart healthy diet ?  ?  ? ? ?Mr. Ricky Lowe was given information about Care Management services by the embedded care coordination team including:  ?Care Management services include personalized support from designated clinical staff supervised by his physician, including individualized plan of care and coordination with other care providers ?24/7 contact phone numbers for assistance for urgent and routine care needs. ?The patient may stop CCM services at any time (effective at the end of the month) by phone call to the office staff. ? ?Patient agreed to services and verbal consent obtained.  ? ?Patient verbalizes understanding of instructions and care plan provided today  and agrees to view in [place]. Active MyChart status confirmed with patient.   ? ?Telephone follow up appointment with care management team member scheduled for: 11/03/21 ? ?Jacqlyn Larsen RNC, BSN ?RN Case Manager ?[place] ?[phone] ? ? ?  ?

## 2021-08-11 NOTE — Chronic Care Management (AMB) (Signed)
? Care Management ?  ? RN Visit Note ? ?08/11/2021 ?Name: Ricky Lowe MRN: [id number] DOB: 1950-07-30 ? ?Subjective: ?Ricky Lowe is a 71 y.o. year old male who is a primary care patient of Pickard, [name], MD. The care management team was consulted for assistance with disease management and care coordination needs.   ? ?Engaged with patient by telephone for initial visit in response to provider referral for case management and/or care coordination services.  ? ?Consent to Services:  ? Ricky Lowe was given information about Care Management services today including:  ?Care Management services includes personalized support from designated clinical staff supervised by his physician, including individualized plan of care and coordination with other care providers ?24/7 contact phone numbers for assistance for urgent and routine care needs. ?The patient may stop case management services at any time by phone call to the office staff. ? ?Patient agreed to services and consent obtained.  ? ?Assessment: Review of patient past medical history, allergies, medications, health status, including review of consultants reports, laboratory and other test data, was performed as part of comprehensive evaluation and provision of chronic care management services.  ? ?SDOH (Social Determinants of Health) assessments and interventions performed:  ?SDOH Interventions   ? ?Flowsheet Row Most Recent Value  ?SDOH Interventions   ?Food Insecurity Interventions Intervention Not Indicated  ?Transportation Interventions Intervention Not Indicated  ? ?  ?  ? ?Care Plan ? ?No Known Allergies ? ?Outpatient Encounter Medications as of 08/11/2021  ?Medication Sig  ? aspirin 81 MG tablet Take 81 mg by mouth daily.  ? lisinopril (ZESTRIL) 40 MG tablet Take 1 tablet (40 mg total) by mouth daily.  ? metFORMIN (GLUCOPHAGE XR) 500 MG 24 hr tablet Take 2 tablets (1,000 mg total) by mouth daily with breakfast.  ? rosuvastatin (CRESTOR) 5 MG tablet TAKE 1  TABLET(5 MG) BY MOUTH DAILY  ? ?No facility-administered encounter medications on file as of 08/11/2021.  ? ? ?Patient Active Problem List  ? Diagnosis Date Noted  ? Pure hypercholesterolemia 08/19/2020  ? Prediabetes 08/19/2020  ? Erectile dysfunction 08/08/2016  ? Colon polyps   ? Cataract   ? Benign essential HTN   ? 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 ? ?Conditions to be addressed/monitored: HTN and HLD ? ?Care Plan : RN Care Manager Plan of Care  ?Updates made by Kassie Mends, RN since 08/11/2021 12:00 AM  ?  ? ?Problem: No plan of care established for management of chronic disease state  (HTN, hypercholesteremia)   ?Priority: High  ?  ? ?Long-Range Goal: Development of plan of care for chronic disease management (HTN, hypercholesteremia)   ?Start Date: 08/11/2021  ?Expected End Date: 02/07/2022  ?Priority: High  ?Note:   ?Current Barriers:  ?Knowledge Deficits related to plan of care for management of HTN and HLD  ?Patient reports he lives with spouse, is independent in all aspects of his care, continues to drive, has pre-diabetes but has not been instructed to check CBG, checks blood pressure on occasion with "good readings", gets out and walks some for exercise, reports has all medications and taking as prescribed. ? ?RNCM Clinical Goal(s):  ?Patient will verbalize understanding of plan for management of HTN and HLD as evidenced by patient report, review of EHR and  through collaboration with RN Care manager, provider, and care team.  ? ?Interventions: ?1:1 collaboration with primary care provider regarding development and update of comprehensive plan of care as evidenced by provider attestation and co-signature ?  Inter-disciplinary care team collaboration (see longitudinal plan of care) ?Evaluation of current treatment plan related to  self management and patient's adherence to plan as established by provider ? ? ?Hyperlipidemia:  (Status: New goal. Goal on Track (progressing): YES.) Long  Term Goal  ?Lab Results  ?Component Value Date  ? CHOL 137 12/08/2020  ? HDL 39 (L) 12/08/2020  ? LDLCALC 78 12/08/2020  ? TRIG 114 12/08/2020  ? CHOLHDL 3.5 12/08/2020  ?Medication review performed; medication list updated in electronic medical record.  ?Provider established cholesterol goals reviewed; ?Counseled on importance of regular laboratory monitoring as prescribed; ?Provided HLD educational materials; ?Reviewed role and benefits of statin for ASCVD risk reduction; ?Reviewed importance of limiting foods high in cholesterol; ?Reviewed exercise goals and target of 150 minutes per week; ?Screening for signs and symptoms of depression related to chronic disease state;  ?Assessed social determinant of health barriers;  ?Education sent via My Chart- heart healthy diet ? ?Hypertension Interventions:  (Status:  New goal. and Goal on track:  Yes.) Long Term Goal ?Last practice recorded BP readings:  ?BP Readings from Last 3 Encounters:  ?12/13/20 134/72  ?05/05/20 138/84  ?12/09/19 131/63  ?Most recent eGFR/CrCl:  ?Lab Results  ?Component Value Date  ? EGFR 89 12/08/2020  ?  No components found for: CRCL ? ?Evaluation of current treatment plan related to hypertension self management and patient's adherence to plan as established by provider ?Reviewed medications with patient and discussed importance of compliance ?Counseled on the importance of exercise goals with target of 150 minutes per week ?Discussed plans with patient for ongoing care management follow up and provided patient with direct contact information for care management team ?Advised patient, providing education and rationale, to monitor blood pressure daily and record, calling PCP for findings outside established parameters ?Discussed complications of poorly controlled blood pressure such as heart disease, stroke, circulatory complications, vision complications, kidney impairment, sexual dysfunction  ?Reviewed importance of adherence to low sodium  diet ?Education sent via My Chart- low sodium diet ? ?Patient Goals/Self-Care Activities: ?Take medications as prescribed   ?Attend all scheduled provider appointments ?Call pharmacy for medication refills 3-7 days in advance of running out of medications ?Attend church or other social activities ?Perform all self care activities independently  ?Perform IADL's (shopping, preparing meals, housekeeping, managing finances) independently ?Call provider office for new concerns or questions  ?check blood pressure weekly ?choose a place to take my blood pressure (home, clinic or office, retail store) ?write blood pressure results in a log or diary ?keep a blood pressure log ?take blood pressure log to all doctor appointments ?keep all doctor appointments ?take medications for blood pressure exactly as prescribed ?eat more whole grains, fruits and vegetables, lean meats and healthy fats ?- call for medicine refill 2 or 3 days before it runs out ?- take all medications exactly as prescribed ?- call doctor with any symptoms you believe are related to your medicine ?- call doctor when you experience any new symptoms ?- go to all doctor appointments as scheduled ?- adhere to prescribed diet: heart healthy, low sodium ?Look over education sent via My Chart- low sodium and heart healthy diet ?  ?  ? ? ?Plan: Telephone follow up appointment with care management team member scheduled for:  11/03/21 ? ?Julie Farmer RNC, BSN ?RN Case Manager ?[facility] ?[phone] ? ? ? ? ? ? ? ? ? ? ?

## 2021-08-13 DIAGNOSIS — E7800 Pure hypercholesterolemia, unspecified: Secondary | ICD-10-CM

## 2021-08-13 DIAGNOSIS — I10 Essential (primary) hypertension: Secondary | ICD-10-CM

## 2021-11-03 ENCOUNTER — Telehealth: Payer: Medicare Other

## 2021-11-27 ENCOUNTER — Ambulatory Visit (INDEPENDENT_AMBULATORY_CARE_PROVIDER_SITE_OTHER): Payer: Medicare Other | Admitting: *Deleted

## 2021-11-27 ENCOUNTER — Encounter: Payer: Self-pay | Admitting: *Deleted

## 2021-11-27 DIAGNOSIS — E7800 Pure hypercholesterolemia, unspecified: Secondary | ICD-10-CM

## 2021-11-27 DIAGNOSIS — I10 Essential (primary) hypertension: Secondary | ICD-10-CM

## 2021-11-27 NOTE — Chronic Care Management (AMB) (Signed)
Chronic Care Management   CCM RN Visit Note  11/27/2021 Name: Ricky Lowe MRN: [id number] DOB: Aug 18, 1950  Subjective: Ricky Lowe is a 71 y.o. year old male who is a primary care patient of Pickard, [name], MD. The care management team was consulted for assistance with disease management and care coordination needs.    Engaged with patient by telephone for follow up visit in response to provider referral for case management and/or care coordination services.   Consent to Services:  The patient was given information about Chronic Care Management services, agreed to services, and gave verbal consent prior to initiation of services.  Please see initial visit note for detailed documentation.   Patient agreed to services and verbal consent obtained.   Assessment: Review of patient past medical history, allergies, medications, health status, including review of consultants reports, laboratory and other test data, was performed as part of comprehensive evaluation and provision of chronic care management services.   SDOH (Social Determinants of Health) assessments and interventions performed:    CCM Care Plan  No Known Allergies  Outpatient Encounter Medications as of 11/27/2021  Medication Sig   aspirin 81 MG tablet Take 81 mg by mouth daily.   lisinopril (ZESTRIL) 40 MG tablet Take 1 tablet (40 mg total) by mouth daily.   metFORMIN (GLUCOPHAGE XR) 500 MG 24 hr tablet Take 2 tablets (1,000 mg total) by mouth daily with breakfast.   rosuvastatin (CRESTOR) 5 MG tablet TAKE 1 TABLET(5 MG) BY MOUTH DAILY   No facility-administered encounter medications on file as of 11/27/2021.    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    Conditions to be addressed/monitored:HTN and  HLD  Care Plan : RN Care Manager Plan of Care  Updates made by Kassie Mends, RN since 11/27/2021 12:00 AM  Completed 11/27/2021   Problem: No plan of care established for management of chronic disease state  (HTN, hypercholesteremia) Resolved 11/27/2021  Priority: High     Long-Range Goal: Development of plan of care for chronic disease management (HTN, hypercholesteremia) Completed 11/27/2021  Start Date: 08/11/2021  Expected End Date: 02/07/2022  Priority: High  Note:   Current Barriers:  Knowledge Deficits related to plan of care for management of HTN and HLD  Patient reports he lives with spouse, is independent in all aspects of his care, continues to drive, has pre-diabetes but has not been instructed to check CBG, checks blood pressure on occasion with "good readings", gets out and walks some for exercise, reports has all medications and taking as prescribed. 11/27/21- spoke with patient who reports he is doing well, no new concerns reported today, has all medications and taking as prescribed.  RNCM Clinical Goal(s):  Patient will verbalize understanding of plan for management of HTN and HLD as evidenced by patient report, review of EHR and  through collaboration with RN Care manager, provider, and care team.   Interventions: 1:1 collaboration with primary care provider regarding development and update of comprehensive plan of care as evidenced by provider attestation and co-signature Inter-disciplinary care team collaboration (see longitudinal plan of care) Evaluation of current treatment plan related to  self management and patient's adherence to plan as established by provider   Hyperlipidemia:  (Status: New goal. Goal on Track (progressing): YES.)  Long Term Goal  Lab Results  Component Value Date   CHOL 137 12/08/2020   HDL 39 (L) 12/08/2020   LDLCALC 78 12/08/2020   TRIG 114 12/08/2020   CHOLHDL 3.5 12/08/2020  Medication review performed; medication list updated in  electronic medical record.  Provider established cholesterol goals reviewed; Counseled on importance of regular laboratory monitoring as prescribed; Provided HLD educational materials; Reviewed role and benefits of statin for ASCVD risk reduction; Reviewed importance of limiting foods high in cholesterol; Reviewed exercise goals and target of 150 minutes per week; Reviewed heart healthy diet Reviewed plan of care with patient including case closure today  Hypertension Interventions:  (Status:  New goal. and Goal on track:  Yes.) Long Term Goal Last practice recorded BP readings:  BP Readings from Last 3 Encounters:  12/13/20 134/72  05/05/20 138/84  12/09/19 131/63  Most recent eGFR/CrCl:  Lab Results  Component Value Date   EGFR 89 12/08/2020    No components found for: CRCL  Evaluation of current treatment plan related to hypertension self management and patient's adherence to plan as established by provider Reviewed medications with patient and discussed importance of compliance Counseled on the importance of exercise goals with target of 150 minutes per week Advised patient, providing education and rationale, to monitor blood pressure daily and record, calling PCP for findings outside established parameters Discussed complications of poorly controlled blood pressure such as heart disease, stroke, circulatory complications, vision complications, kidney impairment, sexual dysfunction  Reviewed low sodium diet  Patient Goals/Self-Care Activities: Take medications as prescribed   Attend all scheduled provider appointments Call pharmacy for medication refills 3-7 days in advance of running out of medications Attend church or other social activities Perform all self care activities independently  Perform IADL's (shopping, preparing meals, housekeeping, managing finances) independently Call provider office for new concerns or questions  check blood pressure weekly choose a place to  take my blood pressure (home, clinic or office, retail store) write blood pressure results in a log or diary keep a blood pressure log take blood pressure log to all doctor appointments keep all doctor appointments take medications for blood pressure exactly as prescribed eat more whole grains, fruits and vegetables, lean meats and healthy fats - call for medicine refill 2 or 3 days before it runs out - take all medications exactly as prescribed - call doctor with any symptoms you believe are related to your medicine - call doctor when you experience any new symptoms - go to all doctor appointments as scheduled - adhere to prescribed diet: heart healthy, low sodium Read food labels and limit fast food Try to do some type of exercise- walking is good Case closure today- please talk with your doctor if you have any future needs for care management       Plan:No further follow up required: case closure   Jacqlyn Larsen [facility], BSN RN Case Manager [place] [phone]

## 2021-11-27 NOTE — Patient Instructions (Signed)
Visit Information  Thank you for taking time to visit with me today. Please don't hesitate to contact me if I can be of assistance to you before our next scheduled telephone appointment.  Following are the goals we discussed today:  Take medications as prescribed   Attend all scheduled provider appointments Call pharmacy for medication refills 3-7 days in advance of running out of medications Attend church or other social activities Perform all self care activities independently  Perform IADL's (shopping, preparing meals, housekeeping, managing finances) independently Call provider office for new concerns or questions  check blood pressure weekly choose a place to take my blood pressure (home, clinic or office, retail store) write blood pressure results in a log or diary keep a blood pressure log take blood pressure log to all doctor appointments keep all doctor appointments take medications for blood pressure exactly as prescribed eat more whole grains, fruits and vegetables, lean meats and healthy fats call for medicine refill 2 or 3 days before it runs out take all medications exactly as prescribed call doctor with any symptoms you believe are related to your medicine call doctor when you experience any new symptoms go to all doctor appointments as scheduled adhere to prescribed diet: heart healthy, low sodium Read food labels and limit fast food Try to do some type of exercise- walking is good Case closure today- please talk with your doctor if you have any future needs for care management   Please call the care guide team at [phone] if you need to cancel or reschedule your appointment.   If you are experiencing a Mental Health or [place] or need someone to talk to, please call the Suicide and Crisis Lifeline: 988 call the [place] National Suicide Prevention Lifeline: 662-148-4545 or TTY: 707 848 5330 TTY [phone]) to talk to a trained counselor call  1-800-273-TALK (toll free, 24 hour hotline) go to [facility] [address], [place] [phone]) call the [place]: [phone] call 911   Patient verbalizes understanding of instructions and care plan provided today and agrees to view in [place]. Active MyChart status and patient understanding of how to access instructions and care plan via MyChart confirmed with patient.     Jacqlyn Larsen RNC, BSN RN Case Manager [facility] [phone]

## 2021-12-01 ENCOUNTER — Telehealth: Payer: Federal, State, Local not specified - PPO

## 2021-12-07 ENCOUNTER — Encounter (INDEPENDENT_AMBULATORY_CARE_PROVIDER_SITE_OTHER): Payer: Medicare Other | Admitting: Ophthalmology

## 2021-12-07 DIAGNOSIS — H43813 Vitreous degeneration, bilateral: Secondary | ICD-10-CM

## 2021-12-07 DIAGNOSIS — H35033 Hypertensive retinopathy, bilateral: Secondary | ICD-10-CM | POA: Diagnosis not present

## 2021-12-07 DIAGNOSIS — D3131 Benign neoplasm of right choroid: Secondary | ICD-10-CM | POA: Diagnosis not present

## 2021-12-07 DIAGNOSIS — H35371 Puckering of macula, right eye: Secondary | ICD-10-CM | POA: Diagnosis not present

## 2021-12-07 DIAGNOSIS — I10 Essential (primary) hypertension: Secondary | ICD-10-CM | POA: Diagnosis not present

## 2021-12-13 ENCOUNTER — Other Ambulatory Visit: Payer: Self-pay | Admitting: Family Medicine

## 2021-12-13 DIAGNOSIS — I10 Essential (primary) hypertension: Secondary | ICD-10-CM

## 2021-12-13 NOTE — Telephone Encounter (Signed)
Requested medications are due for refill today.  yes  Requested medications are on the active medications list.  yes  Last refill. 12/13/2020 #390 3 refills for both  Future visit scheduled.   no  Notes to clinic.  Labs are expired.    Requested Prescriptions  Pending Prescriptions Disp Refills   lisinopril (ZESTRIL) 40 MG tablet [Pharmacy Med Name: LISINOPRIL '40MG'$  TABLETS] 90 tablet 3    Sig: TAKE 1 TABLET(40 MG) BY MOUTH DAILY     Cardiovascular:  ACE Inhibitors Failed - 12/13/2021  6:20 AM      Failed - Cr in normal range and within 180 days    Creat  Date Value Ref Range Status  12/08/2020 0.94 0.70 - 1.35 mg/dL Final         Failed - K in normal range and within 180 days    Potassium  Date Value Ref Range Status  12/08/2020 4.8 3.5 - 5.3 mmol/L Final         Failed - Valid encounter within last 6 months    Recent Outpatient Visits           1 year ago Pure hypercholesterolemia   [place] Pickard, [name], MD   1 year ago Encounter for initial annual wellness visit (AWV) in Medicare patient   [place] Eulogio Bear, NP   1 year ago Concussion without loss of consciousness, initial encounter   [place] Susy Frizzle, MD   2 years ago Tick bite, initial encounter   [place], [place], FNP   2 years ago Polyp of colon, unspecified part of colon, unspecified type   [place] Pickard, [name], MD              Passed - Patient is not pregnant      Passed - Last BP in normal range    BP Readings from Last 1 Encounters:  12/13/20 134/72          rosuvastatin (CRESTOR) 5 MG tablet [Pharmacy Med Name: ROSUVASTATIN '5MG'$  TABLETS] 90 tablet 3    Sig: TAKE 1 TABLET(5 MG) BY MOUTH DAILY     Cardiovascular:  Antilipid - Statins 2 Failed - 12/13/2021  6:20 AM      Failed - Cr in normal range and within 360 days    Creat  Date Value Ref Range Status   12/08/2020 0.94 0.70 - 1.35 mg/dL Final         Failed - Valid encounter within last 12 months    Recent Outpatient Visits           1 year ago Pure hypercholesterolemia   [place], [name], MD   1 year ago Encounter for initial annual wellness visit (AWV) in Medicare patient   [place] Eulogio Bear, NP   1 year ago Concussion without loss of consciousness, initial encounter   [place] Dennard Schaumann, [name], MD   2 years ago Tick bite, initial encounter   [place], [place], FNP   2 years ago Polyp of colon, unspecified part of colon, unspecified type   [place] Pickard, [name], MD              Failed - Lipid Panel in normal range within the last 12 months    Cholesterol  Date Value Ref Range Status  12/08/2020 137 <200 mg/dL Final   LDL Cholesterol (Calc)  Date Value Ref Range Status  12/08/2020 78 mg/dL (calc) Final    Comment:    Reference range: <100 . Desirable range <100 mg/dL for primary prevention;   <70 mg/dL for patients with CHD or diabetic patients  with > or = 2 CHD risk factors. Marland Kitchen LDL-C is now calculated using the Martin-Hopkins  calculation, which is a validated novel method providing  better accuracy than the Friedewald equation in the  estimation of LDL-C.  Cresenciano Genre et al. Annamaria Helling. 4099;278(00): 2061-2068  (http://education.[employer].com/faq/FAQ164)    HDL  Date Value Ref Range Status  12/08/2020 39 (L) > OR = 40 mg/dL Final   Triglycerides  Date Value Ref Range Status  12/08/2020 114 <150 mg/dL Final         Passed - Patient is not pregnant

## 2021-12-14 DIAGNOSIS — E785 Hyperlipidemia, unspecified: Secondary | ICD-10-CM

## 2021-12-14 DIAGNOSIS — I10 Essential (primary) hypertension: Secondary | ICD-10-CM

## 2021-12-15 ENCOUNTER — Other Ambulatory Visit: Payer: Self-pay | Admitting: Family Medicine

## 2021-12-15 DIAGNOSIS — I10 Essential (primary) hypertension: Secondary | ICD-10-CM

## 2021-12-15 NOTE — Telephone Encounter (Signed)
Please call pt for annual appt w/pcp if pt does not have one yet.   Thank you

## 2021-12-25 NOTE — Telephone Encounter (Signed)
Spoke with patient's wife; left message for patient to return call to schedule CPE. Awaiting call back.

## 2021-12-30 ENCOUNTER — Other Ambulatory Visit: Payer: Self-pay | Admitting: Family Medicine

## 2022-01-01 ENCOUNTER — Telehealth: Payer: Self-pay

## 2022-01-01 NOTE — Telephone Encounter (Signed)
Pt called in to request a refill of this med metFORMIN (GLUCOPHAGE XR) 500 MG 24 hr tablet [[id number]]. Please advise.  LOV: 12/13/20  PHARMACY: [facility] [address]  CB: [phone]

## 2022-01-01 NOTE — Telephone Encounter (Signed)
Called, schedule OV. Will refill medications.  Requested Prescriptions  Pending Prescriptions Disp Refills  . metFORMIN (GLUCOPHAGE-XR) 500 MG 24 hr tablet [Pharmacy Med Name: METFORMIN ER 500MG 24HR TABS] 180 tablet 3    Sig: TAKE 2 TABLETS(1000 MG) BY MOUTH DAILY WITH BREAKFAST     Endocrinology:  Diabetes - Biguanides Failed - 12/30/2021 11:36 AM      Failed - Cr in normal range and within 360 days    Creat  Date Value Ref Range Status  12/08/2020 0.94 0.70 - 1.35 mg/dL Final         Failed - HBA1C is between 0 and 7.9 and within 180 days    Hgb A1c MFr Bld  Date Value Ref Range Status  03/22/2021 6.9 (H) <5.7 % of total Hgb Final    Comment:    For someone without known diabetes, a hemoglobin A1c value of 6.5% or greater indicates that they may have  diabetes and this should be confirmed with a follow-up  test. . For someone with known diabetes, a value <7% indicates  that their diabetes is well controlled and a value  greater than or equal to 7% indicates suboptimal  control. A1c targets should be individualized based on  duration of diabetes, age, comorbid conditions, and  other considerations. . Currently, no consensus exists regarding use of hemoglobin A1c for diagnosis of diabetes for children. .          Failed - eGFR in normal range and within 360 days    GFR, Est African American  Date Value Ref Range Status  12/23/2018 79 > OR = 60 mL/min/1.39m Final   GFR calc Af Amer  Date Value Ref Range Status  12/09/2019 >60 >60 mL/min Final   GFR, Est Non African American  Date Value Ref Range Status  12/23/2018 69 > OR = 60 mL/min/1.733mFinal   GFR calc non Af Amer  Date Value Ref Range Status  12/09/2019 >60 >60 mL/min Final   eGFR  Date Value Ref Range Status  12/08/2020 89 > OR = 60 mL/min/1.733minal    Comment:    The eGFR is based on the CKD-EPI 2021 equation. To calculate  the new eGFR from a previous Creatinine or Cystatin C result, go to  https://www.kidney.org/professionals/ kdoqi/gfr%5Fcalculator          Failed - B12 Level in normal range and within 720 days    No results found for: "VITAMINB12"       Failed - Valid encounter within last 6 months    Recent Outpatient Visits          1 year ago Pure hypercholesterolemia   [place]   1 year ago Encounter for initial annual wellness visit (AWV) in Medicare patient   [place]   1 year ago Concussion without loss of consciousness, initial encounter   [place]   2 years ago Tick bite, initial encounter   [place]   3 years ago Polyp of colon, unspecified part of colon, unspecified type   [place]             Failed - CBC within normal limits and completed in the last 12 months    WBC  Date Value Ref Range Status  12/08/2020 4.9 3.8 - 10.8 Thousand/uL Final  RBC  Date Value Ref Range Status  12/08/2020 4.86 4.20 - 5.80 Million/uL Final   Hemoglobin  Date Value Ref Range Status  12/08/2020 14.7 13.2 - 17.1 g/dL Final   HCT  Date Value Ref Range Status  12/08/2020 42.9 38.5 - 50.0 % Final   MCHC  Date Value Ref Range Status  12/08/2020 34.3 32.0 - 36.0 g/dL Final   [facility]  Date Value Ref Range Status  12/08/2020 30.2 27.0 - 33.0 pg Final   MCV  Date Value Ref Range Status  12/08/2020 88.3 80.0 - 100.0 fL Final   No results found for: "PLTCOUNTKUC", "LABPLAT", "POCPLA" RDW  Date Value Ref Range Status  12/08/2020 12.5 11.0 - 15.0 % Final

## 2022-01-08 ENCOUNTER — Ambulatory Visit (INDEPENDENT_AMBULATORY_CARE_PROVIDER_SITE_OTHER): Payer: Medicare Other | Admitting: Family Medicine

## 2022-01-08 VITALS — BP 118/62 | HR 72 | Temp 98.3°F | Ht 71.0 in | Wt 228.6 lb

## 2022-01-08 DIAGNOSIS — E7800 Pure hypercholesterolemia, unspecified: Secondary | ICD-10-CM | POA: Diagnosis not present

## 2022-01-08 DIAGNOSIS — I10 Essential (primary) hypertension: Secondary | ICD-10-CM | POA: Diagnosis not present

## 2022-01-08 DIAGNOSIS — E118 Type 2 diabetes mellitus with unspecified complications: Secondary | ICD-10-CM | POA: Diagnosis not present

## 2022-01-08 DIAGNOSIS — R350 Frequency of micturition: Secondary | ICD-10-CM

## 2022-01-08 DIAGNOSIS — Z23 Encounter for immunization: Secondary | ICD-10-CM

## 2022-01-08 DIAGNOSIS — Z0000 Encounter for general adult medical examination without abnormal findings: Secondary | ICD-10-CM

## 2022-01-08 DIAGNOSIS — Z125 Encounter for screening for malignant neoplasm of prostate: Secondary | ICD-10-CM

## 2022-01-08 MED ORDER — SILDENAFIL CITRATE 100 MG PO TABS: 50.0000 mg | ORAL_TABLET | Freq: Every day | ORAL | 11 refills | Status: DC | PRN

## 2022-01-08 NOTE — Addendum Note (Signed)
Addended by: Randal Buba K on: 01/08/2022 02:52 PM   Modules accepted: Orders

## 2022-01-08 NOTE — Progress Notes (Signed)
Subjective:    Patient ID: Ricky Lowe, male    DOB: 08/01/1950, 71 y.o.   MRN: [id number]  HPI Patient is a very pleasant 71 year old Caucasian gentleman here today for physical exam.  Recently he started checking his blood sugars.  Initially his average blood sugars fasting in the morning with near 197.  Then he started exercising and watching his diet and over the last 2 weeks he dropped his fasting sugars in the morning from 197-159 now down to 138 on average.  However they are still elevated.  He denies polyuria however he does report urinary frequency.  He also reports erectile dysfunction.  He denies any melena or hematochezia.  His last colonoscopy was in 2021 and they found to sessile polyps that were tubular adenomas.  Repeat colonoscopy is recommended in 5 years which would be a 2026.  He is due for prostate cancer screening.  He has had both pneumonia vaccines.  He is due for a flu shot which he prefers today.  He is also due for Shingrix as well as a COVID booster.  He has had shingles once before so he declines Shingrix.  He will get the COVID booster and I recommended that at his pharmacy. Past Medical History:  Diagnosis Date   Arthritis    Cataract    Colon polyps    Dr. Sunny Schlein   Colon polyps    Complication of anesthesia    Diabetes mellitus without complication ([place])    Dyslipidemia    Hypertension    PONV (postoperative nausea and vomiting)    Prediabetes    Past Surgical History:  Procedure Laterality Date   CATARACT EXTRACTION W/PHACO Left 12/21/2016   Procedure: CATARACT EXTRACTION PHACO AND INTRAOCULAR LENS PLACEMENT ([place]);  Surgeon: Baruch Goldmann, MD;  Location: [facility];  Service: Ophthalmology;  Laterality: Left;  CDE: 2.63   CATARACT EXTRACTION W/PHACO Right 01/18/2017   Procedure: CATARACT EXTRACTION PHACO AND INTRAOCULAR LENS PLACEMENT RIGHT EYE;  Surgeon: Baruch Goldmann, MD;  Location: [facility];  Service: Ophthalmology;  Laterality: Right;  CDE: 4.88    COLONOSCOPY N/A 05/06/2019   Procedure: COLONOSCOPY;  Surgeon: Daneil Dolin, MD;  Location: [facility];  Service: Endoscopy;  Laterality: N/A;  10:30   JOINT REPLACEMENT  2010 / 2012   rt hip / lt hip   POLYPECTOMY  05/06/2019   Procedure: POLYPECTOMY;  Surgeon: Daneil Dolin, MD;  Location: [facility];  Service: Endoscopy;;   Current Outpatient Medications on File Prior to Visit  Medication Sig Dispense Refill   aspirin 81 MG tablet Take 81 mg by mouth daily.     lisinopril (ZESTRIL) 40 MG tablet TAKE 1 TABLET(40 MG) BY MOUTH DAILY 30 tablet 0   metFORMIN (GLUCOPHAGE-XR) 500 MG 24 hr tablet TAKE 2 TABLETS(1000 MG) BY MOUTH DAILY WITH BREAKFAST 180 tablet 0   rosuvastatin (CRESTOR) 5 MG tablet TAKE 1 TABLET(5 MG) BY MOUTH DAILY 30 tablet 1   No current facility-administered medications on file prior to visit.   No Known Allergies Social History   Socioeconomic History   Marital status: Married    Spouse name: Not on file   Number of children: Not on file   Years of education: Not on file   Highest education level: Not on file  Occupational History   Not on file  Tobacco Use   Smoking status: Never   Smokeless tobacco: Former    Quit date: 07/19/1989  Vaping Use   Vaping Use:  Never used  Substance and Sexual Activity   Alcohol use: Yes   Drug use: No   Sexual activity: Yes    Birth control/protection: None  Other Topics Concern   Not on file  Social History Narrative   Not on file   Social Determinants of Health   Financial Resource Strain: Not on file  Food Insecurity: No Food Insecurity (08/11/2021)   Hunger Vital Sign    Worried About Running Out of Food in the Last Year: Never true    Ran Out of Food in the Last Year: Never true  Transportation Needs: No Transportation Needs (08/11/2021)   PRAPARE - Hydrologist (Medical): No    Lack of Transportation (Non-Medical): No  Physical Activity: Not on file  Stress: Not on file   Social Connections: Not on file  Intimate Partner Violence: Not on file   Family History  Problem Relation Age of Onset   Diabetes Mother    Hearing loss Mother    Heart disease Mother    Hyperlipidemia Mother    Stroke Mother    Vision loss Mother    Hypertension Father    Arthritis Maternal Grandmother       Review of Systems  All other systems reviewed and are negative.      Objective:   Physical Exam Vitals reviewed.  Constitutional:      General: He is not in acute distress.    Appearance: Normal appearance. He is well-developed. He is obese. He is not ill-appearing, toxic-appearing or diaphoretic.  HENT:     Head: Normocephalic and atraumatic.  Eyes:     General: No visual field deficit or scleral icterus. Neck:     Thyroid: No thyromegaly.     Vascular: No JVD.     Trachea: No tracheal deviation.  Cardiovascular:     Rate and Rhythm: Normal rate and regular rhythm.     Heart sounds: Normal heart sounds. No murmur heard.    No friction rub. No gallop.  Pulmonary:     Effort: Pulmonary effort is normal. No respiratory distress.     Breath sounds: Normal breath sounds. No stridor. No wheezing or rales.  Chest:     Chest wall: No tenderness.  Abdominal:     General: Bowel sounds are normal.     Palpations: Abdomen is soft.  Musculoskeletal:        General: No tenderness.     Right lower leg: No edema.     Left lower leg: No edema.  Neurological:     General: No focal deficit present.     Mental Status: He is alert and oriented to person, place, and time. Mental status is at baseline.     Cranial Nerves: No cranial nerve deficit, dysarthria or facial asymmetry.     Sensory: Sensation is intact.     Motor: Motor function is intact. No tremor or abnormal muscle tone.     Coordination: Coordination is intact. Romberg sign negative. Finger-Nose-Finger Test and Heel to [facility] Test normal. Rapid alternating movements normal.     Gait: Gait is intact.     Deep  Tendon Reflexes: Reflexes are normal and symmetric.  Psychiatric:        Behavior: Behavior normal.        Thought Content: Thought content normal.        Judgment: Judgment normal.           Assessment & Plan:  Controlled type  2 diabetes mellitus with complication, without long-term current use of insulin ([place]) - Plan: CBC with Differential/Platelet, Lipid panel, Microalbumin, urine, COMPLETE METABOLIC PANEL WITH GFR, Hemoglobin A1c  Benign essential HTN - Plan: CBC with Differential/Platelet, Lipid panel, Microalbumin, urine, COMPLETE METABOLIC PANEL WITH GFR, Hemoglobin A1c  Pure hypercholesterolemia - Plan: CBC with Differential/Platelet, Lipid panel, Microalbumin, urine, COMPLETE METABOLIC PANEL WITH GFR, Hemoglobin A1c  General medical exam  Prostate cancer screening - Plan: PSA  Frequency of urination - Plan: PSA Physical exam today is normal.  Blood pressure is excellent at 118/62.  Patient received his flu shot.  He declines the shingles vaccine having already had shingles.  He will get the COVID booster at his pharmacy.  Colonoscopy is not due again until 2026.  I will screen for prostate cancer with a PSA.  Check CBC CMP lipid panel.  Goal LDL cholesterol is less than 100.  Check a urine microalbumin.  Goal albumin to creatinine ratio is less than 30.  Check an A1c.  Goal A1c is less than 6.5.  I anticipate that it would be higher than 6.5.  If so I will recommend metformin.  Patient is also having some success working on his diet.  Diabetic foot exam was performed today and is normal.

## 2022-01-09 LAB — CBC WITH DIFFERENTIAL/PLATELET
Absolute Monocytes: 561 cells/uL (ref 200–950)
Basophils Absolute: 43 cells/uL (ref 0–200)
Basophils Relative: 0.7 %
Eosinophils Absolute: 122 cells/uL (ref 15–500)
Eosinophils Relative: 2 %
HCT: 45.3 % (ref 38.5–50.0)
Hemoglobin: 15.4 g/dL (ref 13.2–17.1)
Lymphs Abs: 2519 cells/uL (ref 850–3900)
MCH: 30.6 pg (ref 27.0–33.0)
MCHC: 34 g/dL (ref 32.0–36.0)
MCV: 90.1 fL (ref 80.0–100.0)
MPV: 10.4 fL (ref 7.5–12.5)
Monocytes Relative: 9.2 %
Neutro Abs: 2855 cells/uL (ref 1500–7800)
Neutrophils Relative %: 46.8 %
Platelets: 191 10*3/uL (ref 140–400)
RBC: 5.03 10*6/uL (ref 4.20–5.80)
RDW: 11.8 % (ref 11.0–15.0)
Total Lymphocyte: 41.3 %
WBC: 6.1 10*3/uL (ref 3.8–10.8)

## 2022-01-09 LAB — MICROALBUMIN, URINE: Microalb, Ur: 0.6 mg/dL

## 2022-01-09 LAB — LIPID PANEL
Cholesterol: 139 mg/dL (ref <200)
HDL: 37 mg/dL — ABNORMAL LOW (ref >=40)
LDL Cholesterol (Calc): 69 mg/dL (calc)
Non-HDL Cholesterol (Calc): 102 mg/dL (calc) (ref <130)
Total CHOL/HDL Ratio: 3.8 (calc) (ref <5.0)
Triglycerides: 238 mg/dL — ABNORMAL HIGH (ref <150)

## 2022-01-09 LAB — COMPLETE METABOLIC PANEL WITH GFR
AG Ratio: 2.1 (calc) (ref 1.0–2.5)
ALT: 52 U/L — ABNORMAL HIGH (ref 9–46)
AST: 35 U/L (ref 10–35)
Albumin: 4.8 g/dL (ref 3.6–5.1)
Alkaline phosphatase (APISO): 38 U/L (ref 35–144)
BUN: 16 mg/dL (ref 7–25)
CO2: 24 mmol/L (ref 20–32)
Calcium: 9.8 mg/dL (ref 8.6–10.3)
Chloride: 103 mmol/L (ref 98–110)
Creat: 0.94 mg/dL (ref 0.70–1.35)
Globulin: 2.3 g/dL (calc) (ref 1.9–3.7)
Glucose, Bld: 78 mg/dL (ref 65–99)
Potassium: 4.5 mmol/L (ref 3.5–5.3)
Sodium: 139 mmol/L (ref 135–146)
Total Bilirubin: 0.5 mg/dL (ref 0.2–1.2)
Total Protein: 7.1 g/dL (ref 6.1–8.1)
eGFR: 88 mL/min/{1.73_m2} (ref >=60)

## 2022-01-09 LAB — HEMOGLOBIN A1C
Hgb A1c MFr Bld: 7 % of total Hgb — ABNORMAL HIGH (ref <5.7)
Mean Plasma Glucose: 154 mg/dL
eAG (mmol/L): 8.5 mmol/L

## 2022-01-09 LAB — PSA: PSA: 0.47 ng/mL (ref <=4.00)

## 2022-01-12 ENCOUNTER — Other Ambulatory Visit: Payer: Self-pay | Admitting: Family Medicine

## 2022-01-12 DIAGNOSIS — I10 Essential (primary) hypertension: Secondary | ICD-10-CM

## 2022-01-12 NOTE — Telephone Encounter (Signed)
Exam 01/08/22- passes RF protocol Requested Prescriptions  Pending Prescriptions Disp Refills  . lisinopril (ZESTRIL) 40 MG tablet [Pharmacy Med Name: LISINOPRIL '40MG'$  TABLETS] 90 tablet 3    Sig: TAKE 1 TABLET(40 MG) BY MOUTH DAILY     Cardiovascular:  ACE Inhibitors Failed - 01/12/2022  6:23 AM      Failed - Valid encounter within last 6 months    Recent Outpatient Visits          1 year ago Pure hypercholesterolemia   [place] Pickard, [name], MD   1 year ago Encounter for initial annual wellness visit (AWV) in Medicare patient   [place] Eulogio Bear, NP   1 year ago Concussion without loss of consciousness, initial encounter   [place] Pickard, [name], MD   2 years ago Tick bite, initial encounter   [place], [place], [place]   3 years ago Polyp of colon, unspecified part of colon, unspecified type   [place] Susy Frizzle, MD      Future Appointments            In 73 months Pickard, [name], MD [place]           Passed - Cr in normal range and within 180 days    Creat  Date Value Ref Range Status  01/08/2022 0.94 0.70 - 1.35 mg/dL Final         Passed - K in normal range and within 180 days    Potassium  Date Value Ref Range Status  01/08/2022 4.5 3.5 - 5.3 mmol/L Final         Passed - Patient is not pregnant      Passed - Last BP in normal range    BP Readings from Last 1 Encounters:  01/08/22 118/62

## 2022-02-08 ENCOUNTER — Other Ambulatory Visit: Payer: Self-pay | Admitting: Family Medicine

## 2022-04-07 ENCOUNTER — Other Ambulatory Visit: Payer: Self-pay | Admitting: Family Medicine

## 2022-07-06 ENCOUNTER — Other Ambulatory Visit: Payer: Self-pay | Admitting: Family Medicine

## 2022-07-09 NOTE — Telephone Encounter (Signed)
Requested medications are due for refill today.  yes  Requested medications are on the active medications list.  yes  Last refill. 04/10/2022 #180 0 rf  Future visit scheduled.   Yes - in 6 months  Notes to clinic.  Missing labs.    Requested Prescriptions  Pending Prescriptions Disp Refills   metFORMIN (GLUCOPHAGE-XR) 500 MG 24 hr tablet [Pharmacy Med Name: METFORMIN ER 500MG  24HR TABS] 180 tablet 0    Sig: TAKE 2 TABLETS(1000 MG) BY MOUTH DAILY WITH BREAKFAST     Endocrinology:  Diabetes - Biguanides Failed - 07/06/2022 10:24 AM      Failed - B12 Level in normal range and within 720 days    No results found for: "VITAMINB12"       Failed - Valid encounter within last 6 months    Recent Outpatient Visits           1 year ago Pure hypercholesterolemia   [place] Ricky Frizzle, MD   1 year ago Encounter for initial annual wellness visit (AWV) in Medicare patient   [place] Ricky Bear, NP   2 years ago Concussion without loss of consciousness, initial encounter   [place] Ricky Frizzle, MD   2 years ago Tick bite, initial encounter   [place], [place], [place]   3 years ago Polyp of colon, unspecified part of colon, unspecified type   [name], Ricky Mcgee, MD       Future Appointments             In 6 months Pickard, Ricky Mcgee, MD [facility]            Passed - Cr in normal range and within 360 days    Creat  Date Value Ref Range Status  01/08/2022 0.94 0.70 - 1.35 mg/dL Final         Passed - HBA1C is between 0 and 7.9 and within 180 days    Hgb A1c MFr Bld  Date Value Ref Range Status  01/08/2022 7.0 (H) <5.7 % of total Hgb Final    Comment:    For someone without known diabetes, a hemoglobin A1c value of 6.5% or greater indicates that they may have  diabetes and this should be confirmed with a  follow-up  test. . For someone with known diabetes, a value <7% indicates  that their diabetes is well controlled and a value  greater than or equal to 7% indicates suboptimal  control. A1c targets should be individualized based on  duration of diabetes, age, comorbid conditions, and  other considerations. . Currently, no consensus exists regarding use of hemoglobin A1c for diagnosis of diabetes for children. .          Passed - eGFR in normal range and within 360 days    GFR, Est African American  Date Value Ref Range Status  12/23/2018 79 > OR = 60 mL/min/1.14m2 Final   GFR calc Af Amer  Date Value Ref Range Status  12/09/2019 >60 >60 mL/min Final   GFR, Est Non African American  Date Value Ref Range Status  12/23/2018 69 > OR = 60 mL/min/1.78m2 Final   GFR calc non Af Amer  Date Value Ref Range Status  12/09/2019 >60 >60 mL/min Final   eGFR  Date Value Ref Range Status  01/08/2022 88 > OR = 60 mL/min/1.89m2 Final  Passed - CBC within normal limits and completed in the last 12 months    WBC  Date Value Ref Range Status  01/08/2022 6.1 3.8 - 10.8 Thousand/uL Final   RBC  Date Value Ref Range Status  01/08/2022 5.03 4.20 - 5.80 Million/uL Final   Hemoglobin  Date Value Ref Range Status  01/08/2022 15.4 13.2 - 17.1 g/dL Final   HCT  Date Value Ref Range Status  01/08/2022 45.3 38.5 - 50.0 % Final   MCHC  Date Value Ref Range Status  01/08/2022 34.0 32.0 - 36.0 g/dL Final   [facility]  Date Value Ref Range Status  01/08/2022 30.6 27.0 - 33.0 pg Final   MCV  Date Value Ref Range Status  01/08/2022 90.1 80.0 - 100.0 fL Final   No results found for: "PLTCOUNTKUC", "LABPLAT", "POCPLA" RDW  Date Value Ref Range Status  01/08/2022 11.8 11.0 - 15.0 % Final

## 2022-07-10 NOTE — Telephone Encounter (Signed)
Received 2nd request from pharmacy for refill of  metFORMIN (GLUCOPHAGE-XR) 500 MG 24 hr tablet   Pharmacy:  [place], [address] [place], [place] [address] Phone: [phone]  Fax: [phone] DEA #: [id number]   Please advise pharmacist at [phone].

## 2022-08-22 ENCOUNTER — Other Ambulatory Visit: Payer: Self-pay | Admitting: Family Medicine

## 2022-08-22 NOTE — Telephone Encounter (Signed)
Requested Prescriptions  Pending Prescriptions Disp Refills   rosuvastatin (CRESTOR) 5 MG tablet [Pharmacy Med Name: ROSUVASTATIN 5MG  TABLETS] 90 tablet 0    Sig: TAKE 1 TABLET(5 MG) BY MOUTH DAILY     Cardiovascular:  Antilipid - Statins 2 Failed - 08/22/2022  3:34 AM      Failed - Valid encounter within last 12 months    Recent Outpatient Visits           1 year ago Pure hypercholesterolemia   [facility] Pickard, Priscille Heidelberg, MD   2 years ago Encounter for initial annual wellness visit (AWV) in Medicare patient   [facility] Valentino Nose, NP   2 years ago Concussion without loss of consciousness, initial encounter   [facility] Pickard, Priscille Heidelberg, MD   2 years ago Tick bite, initial encounter   [facility] Elmore Guise, FNP   3 years ago Polyp of colon, unspecified part of colon, unspecified type   [facility] Pickard, Priscille Heidelberg, MD       Future Appointments             In 4 months Pickard, Priscille Heidelberg, MD [facility]            Failed - Lipid Panel in normal range within the last 12 months    Cholesterol  Date Value Ref Range Status  01/08/2022 139 <200 mg/dL Final   LDL Cholesterol (Calc)  Date Value Ref Range Status  01/08/2022 69 mg/dL (calc) Final    Comment:    Reference range: <100 . Desirable range <100 mg/dL for primary prevention;   <70 mg/dL for patients with CHD or diabetic patients  with > or = 2 CHD risk factors. Marland Kitchen LDL-C is now calculated using the Martin-Hopkins  calculation, which is a validated novel method providing  better accuracy than the Friedewald equation in the  estimation of LDL-C.  Horald Pollen et al. [place] Ahr. 1610;960(45): 2061-2068  (http://education.[employer].com/faq/FAQ164)    HDL  Date Value Ref Range Status  01/08/2022 37 (L) > OR = 40 mg/dL Final   Triglycerides  Date Value Ref Range Status   01/08/2022 238 (H) <150 mg/dL Final    Comment:    . If a non-fasting specimen was collected, consider repeat triglyceride testing on a fasting specimen if clinically indicated.  Perry Mount et al. J. of Clin. Lipidol. 2015;9:129-169. Marland Kitchen          Passed - Cr in normal range and within 360 days    Creat  Date Value Ref Range Status  01/08/2022 0.94 0.70 - 1.35 mg/dL Final         Passed - Patient is not pregnant

## 2022-09-22 ENCOUNTER — Other Ambulatory Visit: Payer: Self-pay | Admitting: Family Medicine

## 2022-09-24 NOTE — Telephone Encounter (Signed)
Requested Prescriptions  Pending Prescriptions Disp Refills   rosuvastatin (CRESTOR) 5 MG tablet [Pharmacy Med Name: ROSUVASTATIN 5MG  TABLETS] 90 tablet 0    Sig: TAKE 1 TABLET(5 MG) BY MOUTH DAILY     Cardiovascular:  Antilipid - Statins 2 Failed - 09/22/2022  3:35 AM      Failed - Valid encounter within last 12 months    Recent Outpatient Visits           1 year ago Pure hypercholesterolemia   [facility] Pickard, Priscille Heidelberg, MD   2 years ago Encounter for initial annual wellness visit (AWV) in Medicare patient   [facility] Valentino Nose, NP   2 years ago Concussion without loss of consciousness, initial encounter   [facility] Pickard, Priscille Heidelberg, MD   3 years ago Tick bite, initial encounter   [facility] Elmore Guise, FNP   3 years ago Polyp of colon, unspecified part of colon, unspecified type   [facility] Pickard, Priscille Heidelberg, MD       Future Appointments             In 3 months Pickard, Priscille Heidelberg, MD [facility], [facility]            Failed - Lipid Panel in normal range within the last 12 months    Cholesterol  Date Value Ref Range Status  01/08/2022 139 <200 mg/dL Final   LDL Cholesterol (Calc)  Date Value Ref Range Status  01/08/2022 69 mg/dL (calc) Final    Comment:    Reference range: <100 . Desirable range <100 mg/dL for primary prevention;   <70 mg/dL for patients with CHD or diabetic patients  with > or = 2 CHD risk factors. Marland Kitchen LDL-C is now calculated using the Martin-Hopkins  calculation, which is a validated novel method providing  better accuracy than the Friedewald equation in the  estimation of LDL-C.  Horald Pollen et al. Lenox Ahr. 1610;960(45): 2061-2068  (http://education.[facility].com/faq/FAQ164)    HDL  Date Value Ref Range Status  01/08/2022 37 (L) > OR = 40 mg/dL Final   Triglycerides  Date Value Ref Range Status   01/08/2022 238 (H) <150 mg/dL Final    Comment:    . If a non-fasting specimen was collected, consider repeat triglyceride testing on a fasting specimen if clinically indicated.  Perry Mount et al. J. of Clin. Lipidol. 2015;9:129-169. Marland Kitchen          Passed - Cr in normal range and within 360 days    Creat  Date Value Ref Range Status  01/08/2022 0.94 0.70 - 1.35 mg/dL Final         Passed - Patient is not pregnant

## 2022-10-22 ENCOUNTER — Other Ambulatory Visit: Payer: Self-pay | Admitting: Family Medicine

## 2022-10-22 NOTE — Telephone Encounter (Signed)
Upcoming appointment- 01/10/23 Requested Prescriptions  Pending Prescriptions Disp Refills   metFORMIN (GLUCOPHAGE-XR) 500 MG 24 hr tablet [Pharmacy Med Name: METFORMIN ER 500MG  24HR TABS] 180 tablet 0    Sig: TAKE 2 TABLETS(1000 MG) BY MOUTH DAILY WITH BREAKFAST     Endocrinology:  Diabetes - Biguanides Failed - 10/22/2022  9:39 AM      Failed - HBA1C is between 0 and 7.9 and within 180 days    Hgb A1c MFr Bld  Date Value Ref Range Status  01/08/2022 7.0 (H) <5.7 % of total Hgb Final    Comment:    For someone without known diabetes, a hemoglobin A1c value of 6.5% or greater indicates that they may have  diabetes and this should be confirmed with a follow-up  test. . For someone with known diabetes, a value <7% indicates  that their diabetes is well controlled and a value  greater than or equal to 7% indicates suboptimal  control. A1c targets should be individualized based on  duration of diabetes, age, comorbid conditions, and  other considerations. . Currently, no consensus exists regarding use of hemoglobin A1c for diagnosis of diabetes for children. .          Failed - B12 Level in normal range and within 720 days    No results found for: "VITAMINB12"       Failed - Valid encounter within last 6 months    Recent Outpatient Visits           1 year ago Pure hypercholesterolemia   [facility] Pickard, Priscille Heidelberg, MD   2 years ago Encounter for initial annual wellness visit (AWV) in Medicare patient   [facility] Valentino Nose, NP   2 years ago Concussion without loss of consciousness, initial encounter   [facility] Tanya Nones, Priscille Heidelberg, MD   3 years ago Tick bite, initial encounter   [facility] Elmore Guise, FNP   3 years ago Polyp of colon, unspecified part of colon, unspecified type   [facility] Pickard, Priscille Heidelberg, MD       Future Appointments             In 2 months  Pickard, Priscille Heidelberg, MD [facility]            Passed - Cr in normal range and within 360 days    Creat  Date Value Ref Range Status  01/08/2022 0.94 0.70 - 1.35 mg/dL Final         Passed - eGFR in normal range and within 360 days    GFR, Est African American  Date Value Ref Range Status  12/23/2018 79 > OR = 60 mL/min/1.60m2 Final   GFR calc Af Amer  Date Value Ref Range Status  12/09/2019 >60 >60 mL/min Final   GFR, Est Non African American  Date Value Ref Range Status  12/23/2018 69 > OR = 60 mL/min/1.62m2 Final   GFR calc non Af Amer  Date Value Ref Range Status  12/09/2019 >60 >60 mL/min Final   eGFR  Date Value Ref Range Status  01/08/2022 88 > OR = 60 mL/min/1.102m2 Final         Passed - CBC within normal limits and completed in the last 12 months    WBC  Date Value Ref Range Status  01/08/2022 6.1 3.8 - 10.8 Thousand/uL Final   RBC  Date Value  Ref Range Status  01/08/2022 5.03 4.20 - 5.80 Million/uL Final   Hemoglobin  Date Value Ref Range Status  01/08/2022 15.4 13.2 - 17.1 g/dL Final   HCT  Date Value Ref Range Status  01/08/2022 45.3 38.5 - 50.0 % Final   MCHC  Date Value Ref Range Status  01/08/2022 34.0 32.0 - 36.0 g/dL Final   [facility]  Date Value Ref Range Status  01/08/2022 30.6 27.0 - 33.0 pg Final   MCV  Date Value Ref Range Status  01/08/2022 90.1 80.0 - 100.0 fL Final   No results found for: "PLTCOUNTKUC", "LABPLAT", "POCPLA" RDW  Date Value Ref Range Status  01/08/2022 11.8 11.0 - 15.0 % Final

## 2022-12-10 ENCOUNTER — Encounter (INDEPENDENT_AMBULATORY_CARE_PROVIDER_SITE_OTHER): Payer: Medicare Other | Admitting: Ophthalmology

## 2022-12-10 DIAGNOSIS — H43813 Vitreous degeneration, bilateral: Secondary | ICD-10-CM

## 2022-12-10 DIAGNOSIS — H35033 Hypertensive retinopathy, bilateral: Secondary | ICD-10-CM | POA: Diagnosis not present

## 2022-12-10 DIAGNOSIS — I10 Essential (primary) hypertension: Secondary | ICD-10-CM | POA: Diagnosis not present

## 2022-12-10 DIAGNOSIS — H35371 Puckering of macula, right eye: Secondary | ICD-10-CM | POA: Diagnosis not present

## 2022-12-10 DIAGNOSIS — D3131 Benign neoplasm of right choroid: Secondary | ICD-10-CM

## 2022-12-26 ENCOUNTER — Other Ambulatory Visit: Payer: Self-pay | Admitting: Family Medicine

## 2022-12-27 ENCOUNTER — Other Ambulatory Visit: Payer: Self-pay | Admitting: Family Medicine

## 2022-12-27 NOTE — Telephone Encounter (Signed)
Requested Prescriptions  Pending Prescriptions Disp Refills   rosuvastatin (CRESTOR) 5 MG tablet [Pharmacy Med Name: ROSUVASTATIN 5MG  TABLETS] 90 tablet 0    Sig: TAKE 1 TABLET(5 MG) BY MOUTH DAILY     Cardiovascular:  Antilipid - Statins 2 Failed - 12/26/2022  9:16 AM      Failed - Valid encounter within last 12 months    Recent Outpatient Visits           2 years ago Pure hypercholesterolemia   [facility] Pickard, Priscille Heidelberg, MD   2 years ago Encounter for initial annual wellness visit (AWV) in Medicare patient   [facility] Valentino Nose, NP   2 years ago Concussion without loss of consciousness, initial encounter   [facility] Pickard, Priscille Heidelberg, MD   3 years ago Tick bite, initial encounter   [facility] Elmore Guise, FNP   4 years ago Polyp of colon, unspecified part of colon, unspecified type   [facility] Pickard, Priscille Heidelberg, MD       Future Appointments             In 2 weeks Tanya Nones, Priscille Heidelberg, MD [facility]            Failed - Lipid Panel in normal range within the last 12 months    Cholesterol  Date Value Ref Range Status  01/08/2022 139 <200 mg/dL Final   LDL Cholesterol (Calc)  Date Value Ref Range Status  01/08/2022 69 mg/dL (calc) Final    Comment:    Reference range: <100 . Desirable range <100 mg/dL for primary prevention;   <70 mg/dL for patients with CHD or diabetic patients  with > or = 2 CHD risk factors. Marland Kitchen LDL-C is now calculated using the Martin-Hopkins  calculation, which is a validated novel method providing  better accuracy than the Friedewald equation in the  estimation of LDL-C.  Horald Pollen et al. Lenox Ahr. 2536;644(03): 2061-2068  (http://education.[facility].com/faq/FAQ164)    HDL  Date Value Ref Range Status  01/08/2022 37 (L) > OR = 40 mg/dL Final   Triglycerides  Date Value Ref Range Status   01/08/2022 238 (H) <150 mg/dL Final    Comment:    . If a non-fasting specimen was collected, consider repeat triglyceride testing on a fasting specimen if clinically indicated.  Perry Mount et al. J. of Clin. Lipidol. 2015;9:129-169. Marland Kitchen          Passed - Cr in normal range and within 360 days    Creat  Date Value Ref Range Status  01/08/2022 0.94 0.70 - 1.35 mg/dL Final         Passed - Patient is not pregnant

## 2022-12-27 NOTE — Telephone Encounter (Signed)
Request was refilled 12/27/22, duplicate request.  Requested Prescriptions  Pending Prescriptions Disp Refills   rosuvastatin (CRESTOR) 5 MG tablet [Pharmacy Med Name: ROSUVASTATIN 5MG  TABLETS] 90 tablet     Sig: TAKE 1 TABLET(5 MG) BY MOUTH DAILY     Cardiovascular:  Antilipid - Statins 2 Failed - 12/27/2022 11:01 AM      Failed - Valid encounter within last 12 months    Recent Outpatient Visits           2 years ago Pure hypercholesterolemia   [facility] Pickard, Priscille Heidelberg, MD   2 years ago Encounter for initial annual wellness visit (AWV) in Medicare patient   [facility] Valentino Nose, NP   2 years ago Concussion without loss of consciousness, initial encounter   [facility] Pickard, Priscille Heidelberg, MD   3 years ago Tick bite, initial encounter   [facility] Elmore Guise, FNP   4 years ago Polyp of colon, unspecified part of colon, unspecified type   [facility] Pickard, Priscille Heidelberg, MD       Future Appointments             In 2 weeks Tanya Nones, Priscille Heidelberg, MD [facility]            Failed - Lipid Panel in normal range within the last 12 months    Cholesterol  Date Value Ref Range Status  01/08/2022 139 <200 mg/dL Final   LDL Cholesterol (Calc)  Date Value Ref Range Status  01/08/2022 69 mg/dL (calc) Final    Comment:    Reference range: <100 . Desirable range <100 mg/dL for primary prevention;   <70 mg/dL for patients with CHD or diabetic patients  with > or = 2 CHD risk factors. Marland Kitchen LDL-C is now calculated using the Martin-Hopkins  calculation, which is a validated novel method providing  better accuracy than the Friedewald equation in the  estimation of LDL-C.  Horald Pollen et al. Lenox Ahr. 1761;607(37): 2061-2068  (http://education.[facility].com/faq/FAQ164)    HDL  Date Value Ref Range Status  01/08/2022 37 (L) > OR = 40 mg/dL Final    Triglycerides  Date Value Ref Range Status  01/08/2022 238 (H) <150 mg/dL Final    Comment:    . If a non-fasting specimen was collected, consider repeat triglyceride testing on a fasting specimen if clinically indicated.  Perry Mount et al. J. of Clin. Lipidol. 2015;9:129-169. Marland Kitchen          Passed - Cr in normal range and within 360 days    Creat  Date Value Ref Range Status  01/08/2022 0.94 0.70 - 1.35 mg/dL Final         Passed - Patient is not pregnant

## 2023-01-10 ENCOUNTER — Encounter: Payer: Self-pay | Admitting: Family Medicine

## 2023-01-10 ENCOUNTER — Ambulatory Visit (INDEPENDENT_AMBULATORY_CARE_PROVIDER_SITE_OTHER): Payer: Medicare Other | Admitting: Family Medicine

## 2023-01-10 VITALS — BP 128/68 | HR 66 | Temp 97.6°F | Ht 71.0 in | Wt 220.0 lb

## 2023-01-10 DIAGNOSIS — Z23 Encounter for immunization: Secondary | ICD-10-CM | POA: Diagnosis not present

## 2023-01-10 DIAGNOSIS — Z125 Encounter for screening for malignant neoplasm of prostate: Secondary | ICD-10-CM

## 2023-01-10 DIAGNOSIS — N401 Enlarged prostate with lower urinary tract symptoms: Secondary | ICD-10-CM

## 2023-01-10 DIAGNOSIS — E118 Type 2 diabetes mellitus with unspecified complications: Secondary | ICD-10-CM | POA: Diagnosis not present

## 2023-01-10 DIAGNOSIS — E7800 Pure hypercholesterolemia, unspecified: Secondary | ICD-10-CM

## 2023-01-10 DIAGNOSIS — Z0001 Encounter for general adult medical examination with abnormal findings: Secondary | ICD-10-CM | POA: Diagnosis not present

## 2023-01-10 DIAGNOSIS — Z0000 Encounter for general adult medical examination without abnormal findings: Secondary | ICD-10-CM

## 2023-01-10 DIAGNOSIS — I10 Essential (primary) hypertension: Secondary | ICD-10-CM

## 2023-01-10 LAB — CBC WITH DIFFERENTIAL/PLATELET
Absolute Monocytes: 624 cells/uL (ref 200–950)
Basophils Absolute: 32 cells/uL (ref 0–200)
Basophils Relative: 0.8 %
Eosinophils Absolute: 200 cells/uL (ref 15–500)
Eosinophils Relative: 5 %
HCT: 43.7 % (ref 38.5–50.0)
Hemoglobin: 14.5 g/dL (ref 13.2–17.1)
Lymphs Abs: 1496 cells/uL (ref 850–3900)
MCH: 29.5 pg (ref 27.0–33.0)
MCHC: 33.2 g/dL (ref 32.0–36.0)
MCV: 89 fL (ref 80.0–100.0)
MPV: 10.4 fL (ref 7.5–12.5)
Monocytes Relative: 15.6 %
Neutro Abs: 1648 cells/uL (ref 1500–7800)
Neutrophils Relative %: 41.2 %
Platelets: 159 10*3/uL (ref 140–400)
RBC: 4.91 10*6/uL (ref 4.20–5.80)
RDW: 12.6 % (ref 11.0–15.0)
Total Lymphocyte: 37.4 %
WBC: 4 10*3/uL (ref 3.8–10.8)

## 2023-01-10 NOTE — Addendum Note (Signed)
Addended by: Venia Carbon K on: 01/10/2023 11:35 AM   Modules accepted: Orders

## 2023-01-10 NOTE — Progress Notes (Signed)
Subjective:    Patient ID: Ricky Lowe, male    DOB: [birth year], 72 y.o.   MRN: [id number]  HPI Patient is a very pleasant 72 year old patient, with history of diabetes mellitus type 2.  History of smoking blood pressure elevated in office 160.  He denies any polyuria polydipsia or blurry vision.  He is due for a PSA to screen for prostate cancer.  His last colonoscopy was in 2021 and was significant for tubular adenoma.  They recommended repeat colonoscopy in 2026.  He denies any chest pain shortness of breath or dyspnea on exertion.  Diabetic foot exam was performed today and was normal.  He is due for flu shot.  He is due for a COVID-vaccine.  He declines a shingles shot. Past Medical History:  Diagnosis Date   Arthritis    Cataract    Colon polyps    Dr. Glean Hess   Colon polyps    Complication of anesthesia    Diabetes mellitus without complication (HCC)    Dyslipidemia    Hypertension    PONV (postoperative nausea and vomiting)    Prediabetes    Past Surgical History:  Procedure Laterality Date   CATARACT EXTRACTION W/PHACO Left 12/21/2016   Procedure: CATARACT EXTRACTION PHACO AND INTRAOCULAR LENS PLACEMENT (IOC);  Surgeon: Fabio Pierce, MD;  Location: [facility];  Service: Ophthalmology;  Laterality: Left;  CDE: 2.63   CATARACT EXTRACTION W/PHACO Right 01/18/2017   Procedure: CATARACT EXTRACTION PHACO AND INTRAOCULAR LENS PLACEMENT RIGHT EYE;  Surgeon: Fabio Pierce, MD;  Location: [facility];  Service: Ophthalmology;  Laterality: Right;  CDE: 4.88   COLONOSCOPY N/A 05/06/2019   Procedure: COLONOSCOPY;  Surgeon: Corbin Ade, MD;  Location: [facility];  Service: Endoscopy;  Laterality: N/A;  10:30   JOINT REPLACEMENT  2010 / 2012   rt hip / lt hip   POLYPECTOMY  05/06/2019   Procedure: POLYPECTOMY;  Surgeon: Corbin Ade, MD;  Location: [facility];  Service: Endoscopy;;   Current Outpatient Medications on File Prior to Visit  Medication Sig Dispense Refill   aspirin 81 MG  tablet Take 81 mg by mouth daily.     lisinopril (ZESTRIL) 40 MG tablet TAKE 1 TABLET(40 MG) BY MOUTH DAILY 90 tablet 3   metFORMIN (GLUCOPHAGE-XR) 500 MG 24 hr tablet TAKE 2 TABLETS(1000 MG) BY MOUTH DAILY WITH BREAKFAST 180 tablet 0   rosuvastatin (CRESTOR) 5 MG tablet TAKE 1 TABLET(5 MG) BY MOUTH DAILY 30 tablet 0   sildenafil (VIAGRA) 100 MG tablet Take 0.5-1 tablets (50-100 mg total) by mouth daily as needed for erectile dysfunction. 5 tablet 11   No current facility-administered medications on file prior to visit.   No Known Allergies Social History   Socioeconomic History   Marital status: Married    Spouse name: Not on file   Number of children: Not on file   Years of education: Not on file   Highest education level: Not on file  Occupational History   Not on file  Tobacco Use   Smoking status: Never   Smokeless tobacco: Former    Quit date: 07/19/1989  Vaping Use   Vaping status: Never Used  Substance and Sexual Activity   Alcohol use: Yes   Drug use: No   Sexual activity: Yes    Birth control/protection: None  Other Topics Concern   Not on file  Social History Narrative   Not on file   Social Determinants of Health   Financial Resource Strain:  Not on file  Food Insecurity: No Food Insecurity (08/11/2021)   Hunger Vital Sign    Worried About Running Out of Food in the Last Year: Never true    Ran Out of Food in the Last Year: Never true  Transportation Needs: No Transportation Needs (08/11/2021)   PRAPARE - Administrator, Civil Service (Medical): No    Lack of Transportation (Non-Medical): No  Physical Activity: Not on file  Stress: Not on file  Social Connections: Not on file  Intimate Partner Violence: Not on file   Family History  Problem Relation Age of Onset   Diabetes Mother    Hearing loss Mother    Heart disease Mother    Hyperlipidemia Mother    Stroke Mother    Vision loss Mother    Hypertension Father    Arthritis Maternal  Grandmother       Review of Systems  All other systems reviewed and are negative.      Objective:   Physical Exam Vitals reviewed.  Constitutional:      General: He is not in acute distress.    Appearance: Normal appearance. He is well-developed. He is obese. He is not ill-appearing, toxic-appearing or diaphoretic.  HENT:     Head: Normocephalic and atraumatic.  Eyes:     General: No visual field deficit or scleral icterus. Neck:     Thyroid: No thyromegaly.     Vascular: No JVD.     Trachea: No tracheal deviation.  Cardiovascular:     Rate and Rhythm: Normal rate and regular rhythm.     Heart sounds: Normal heart sounds. No murmur heard.    No friction rub. No gallop.  Pulmonary:     Effort: Pulmonary effort is normal. No respiratory distress.     Breath sounds: Normal breath sounds. No stridor. No wheezing or rales.  Chest:     Chest wall: No tenderness.  Abdominal:     General: Bowel sounds are normal.     Palpations: Abdomen is soft.  Musculoskeletal:        General: No tenderness.     Right lower leg: No edema.     Left lower leg: No edema.  Neurological:     General: No focal deficit present.     Mental Status: He is alert and oriented to person, place, and time. Mental status is at baseline.     Cranial Nerves: No cranial nerve deficit, dysarthria or facial asymmetry.     Sensory: Sensation is intact.     Motor: Motor function is intact. No tremor or abnormal muscle tone.     Coordination: Coordination is intact. Romberg sign negative. Finger-Nose-Finger Test and Heel to [facility] Test normal. Rapid alternating movements normal.     Gait: Gait is intact.     Deep Tendon Reflexes: Reflexes are normal and symmetric.  Psychiatric:        Behavior: Behavior normal.        Thought Content: Thought content normal.        Judgment: Judgment normal.           Assessment & Plan:  Controlled type 2 diabetes mellitus with complication, without long-term current use  of insulin (HCC) - Plan: Hemoglobin A1c, CBC with Differential/Platelet, COMPLETE METABOLIC PANEL WITH GFR, Lipid panel, Protein / Creatinine Ratio, Urine, CT CARDIAC SCORING (SELF PAY ONLY)  Prostate cancer screening - Plan: PSA  Benign prostatic hyperplasia with lower urinary tract symptoms, symptom details unspecified -  Plan: PSA  Benign essential HTN  Pure hypercholesterolemia  General medical exam  Physical exam today is normal.  Blood pressure is excellent.  Patient declines the shingles vaccine.  He received his flu shot today.  He does report increased urinary frequency.  I am concerned that he may have BPH.  I would like to screen for prostate cancer with PSA.  Colon cancer screening is up-to-date.  Check a hemoglobin A1c.  If elevated, I plan to increase metformin to 2000 mg a day.  Check a fasting lipid panel and also check a urine protein to creatinine ratio.  We discussed today CT cardiac scoring and the patient would like to proceed with this to ascertain whether he should take a higher dose statin given his history of diabetes.  Ideally I would like his LDL to be below 100.

## 2023-01-11 LAB — COMPLETE METABOLIC PANEL WITH GFR
AG Ratio: 2.1 (calc) (ref 1.0–2.5)
ALT: 37 U/L (ref 9–46)
AST: 40 U/L — ABNORMAL HIGH (ref 10–35)
Albumin: 4.6 g/dL (ref 3.6–5.1)
Alkaline phosphatase (APISO): 48 U/L (ref 35–144)
BUN: 16 mg/dL (ref 7–25)
CO2: 25 mmol/L (ref 20–32)
Calcium: 9.4 mg/dL (ref 8.6–10.3)
Chloride: 104 mmol/L (ref 98–110)
Creat: 0.98 mg/dL (ref 0.70–1.35)
Globulin: 2.2 g/dL (ref 1.9–3.7)
Glucose, Bld: 160 mg/dL — ABNORMAL HIGH (ref 65–99)
Potassium: 4.5 mmol/L (ref 3.5–5.3)
Sodium: 137 mmol/L (ref 135–146)
Total Bilirubin: 0.7 mg/dL (ref 0.2–1.2)
Total Protein: 6.8 g/dL (ref 6.1–8.1)
eGFR: 83 mL/min/{1.73_m2} (ref >=60)

## 2023-01-11 LAB — PSA: PSA: 0.49 ng/mL (ref <=4.00)

## 2023-01-11 LAB — LIPID PANEL
Cholesterol: 146 mg/dL (ref <200)
HDL: 33 mg/dL — ABNORMAL LOW (ref >=40)
LDL Cholesterol (Calc): 89 mg/dL
Non-HDL Cholesterol (Calc): 113 mg/dL (ref <130)
Total CHOL/HDL Ratio: 4.4 (calc) (ref <5.0)
Triglycerides: 143 mg/dL (ref <150)

## 2023-01-11 LAB — HEMOGLOBIN A1C
Hgb A1c MFr Bld: 7.8 %{Hb} — ABNORMAL HIGH (ref <5.7)
Mean Plasma Glucose: 177 mg/dL
eAG (mmol/L): 9.8 mmol/L

## 2023-01-11 LAB — PROTEIN / CREATININE RATIO, URINE
Creatinine, Urine: 111 mg/dL (ref 20–320)
Protein/Creat Ratio: 171 mg/g{creat} — ABNORMAL HIGH (ref 25–148)
Protein/Creatinine Ratio: 0.171 mg/mg{creat} — ABNORMAL HIGH (ref 0.025–0.148)
Total Protein, Urine: 19 mg/dL (ref 5–25)

## 2023-01-12 ENCOUNTER — Other Ambulatory Visit: Payer: Self-pay | Admitting: Family Medicine

## 2023-01-12 DIAGNOSIS — I10 Essential (primary) hypertension: Secondary | ICD-10-CM

## 2023-01-14 ENCOUNTER — Other Ambulatory Visit: Payer: Self-pay

## 2023-01-14 DIAGNOSIS — E7800 Pure hypercholesterolemia, unspecified: Secondary | ICD-10-CM

## 2023-01-14 MED ORDER — EMPAGLIFLOZIN 25 MG PO TABS: 25.0000 mg | ORAL_TABLET | Freq: Every day | ORAL | 3 refills | Status: DC

## 2023-01-14 MED ORDER — ROSUVASTATIN CALCIUM 5 MG PO TABS: ORAL_TABLET | ORAL | 1 refills | Status: DC

## 2023-01-14 MED ORDER — METFORMIN HCL 1000 MG PO TABS: 1000.0000 mg | ORAL_TABLET | Freq: Two times a day (BID) | ORAL | 3 refills | Status: DC

## 2023-01-14 NOTE — Telephone Encounter (Signed)
Patient called to follow up on refills for  metFORMIN (GLUCOPHAGE-XR) 500 MG 24 hr tablet [[id number]] (DOSE INCREASE DUE)  lisinopril (ZESTRIL) 40 MG tablet [[id number]] (90 DAY SUPPLY NEEDED)  rosuvastatin (CRESTOR) 5 MG tablet [[id number]] (PARTIAL REFILL RECEIVED)  New medication added but not received at pharmacy for  [facility]   **LOV 01/10/23 (cpe)**  Pharmacy confirmed as:  [id number] - [place], [place] - [address], [place] [address] Phone: [phone]  Fax: [phone] DEA #: [id number]   Please advise patienta t [phone].

## 2023-01-14 NOTE — Telephone Encounter (Signed)
Requested Prescriptions  Pending Prescriptions Disp Refills   lisinopril (ZESTRIL) 40 MG tablet [Pharmacy Med Name: LISINOPRIL 40MG  TABLETS] 90 tablet 3    Sig: TAKE 1 TABLET(40 MG) BY MOUTH DAILY     Cardiovascular:  ACE Inhibitors Failed - 01/12/2023  6:28 AM      Failed - Valid encounter within last 6 months    Recent Outpatient Visits           2 years ago Pure hypercholesterolemia   [facility] Pickard, Priscille Heidelberg, MD   2 years ago Encounter for initial annual wellness visit (AWV) in Medicare patient   [facility] Valentino Nose, NP   2 years ago Concussion without loss of consciousness, initial encounter   [facility] Donita Brooks, MD   3 years ago Tick bite, initial encounter   [facility] Elmore Guise, FNP   4 years ago Polyp of colon, unspecified part of colon, unspecified type   [facility] Pickard, Priscille Heidelberg, MD              Passed - Cr in normal range and within 180 days    Creat  Date Value Ref Range Status  01/10/2023 0.98 0.70 - 1.35 mg/dL Final   Creatinine, Urine  Date Value Ref Range Status  01/10/2023 111 20 - 320 mg/dL Final         Passed - K in normal range and within 180 days    Potassium  Date Value Ref Range Status  01/10/2023 4.5 3.5 - 5.3 mmol/L Final         Passed - Patient is not pregnant      Passed - Last BP in normal range    BP Readings from Last 1 Encounters:  01/10/23 128/68

## 2023-01-30 ENCOUNTER — Encounter: Payer: Self-pay | Admitting: Family Medicine

## 2023-01-30 ENCOUNTER — Telehealth: Payer: Self-pay

## 2023-01-30 ENCOUNTER — Other Ambulatory Visit: Payer: Self-pay | Admitting: Family Medicine

## 2023-01-30 NOTE — Telephone Encounter (Signed)
Unable to refill per protocol, Rx expired. Discontinued 01/14/23, dose change.  Requested Prescriptions  Pending Prescriptions Disp Refills   metFORMIN (GLUCOPHAGE-XR) 500 MG 24 hr tablet [Pharmacy Med Name: METFORMIN ER 500MG  24HR TABS] 180 tablet 0    Sig: TAKE 2 TABLETS(1000 MG) BY MOUTH DAILY WITH BREAKFAST     Endocrinology:  Diabetes - Biguanides Failed - 01/30/2023 10:26 AM      Failed - B12 Level in normal range and within 720 days    No results found for: "VITAMINB12"       Failed - Valid encounter within last 6 months    Recent Outpatient Visits           2 years ago Pure hypercholesterolemia   [facility] Tanya Nones, Priscille Heidelberg, MD   2 years ago Encounter for initial annual wellness visit (AWV) in Medicare patient   [facility] Valentino Nose, NP   2 years ago Concussion without loss of consciousness, initial encounter   [facility] Tanya Nones, Priscille Heidelberg, MD   3 years ago Tick bite, initial encounter   [facility] Elmore Guise, FNP   4 years ago Polyp of colon, unspecified part of colon, unspecified type   [facility] Pickard, Priscille Heidelberg, MD              Passed - Cr in normal range and within 360 days    Creat  Date Value Ref Range Status  01/10/2023 0.98 0.70 - 1.35 mg/dL Final   Creatinine, Urine  Date Value Ref Range Status  01/10/2023 111 20 - 320 mg/dL Final         Passed - HBA1C is between 0 and 7.9 and within 180 days    Hgb A1c MFr Bld  Date Value Ref Range Status  01/10/2023 7.8 (H) <5.7 % of total Hgb Final    Comment:    For someone without known diabetes, a hemoglobin A1c value of 6.5% or greater indicates that they may have  diabetes and this should be confirmed with a follow-up  test. . For someone with known diabetes, a value <7% indicates  that their diabetes is well controlled and a value  greater than or equal to 7% indicates suboptimal  control. A1c  targets should be individualized based on  duration of diabetes, age, comorbid conditions, and  other considerations. . Currently, no consensus exists regarding use of hemoglobin A1c for diagnosis of diabetes for children. .          Passed - eGFR in normal range and within 360 days    GFR, Est African American  Date Value Ref Range Status  12/23/2018 79 > OR = 60 mL/min/1.33m2 Final   GFR calc Af Amer  Date Value Ref Range Status  12/09/2019 >60 >60 mL/min Final   GFR, Est Non African American  Date Value Ref Range Status  12/23/2018 69 > OR = 60 mL/min/1.3m2 Final   GFR calc non Af Amer  Date Value Ref Range Status  12/09/2019 >60 >60 mL/min Final   eGFR  Date Value Ref Range Status  01/10/2023 83 > OR = 60 mL/min/1.23m2 Final         Passed - CBC within normal limits and completed in the last 12 months    WBC  Date Value Ref Range Status  01/10/2023 4.0 3.8 - 10.8 Thousand/uL Final   RBC  Date Value Ref Range Status  01/10/2023 4.91  4.20 - 5.80 Million/uL Final   Hemoglobin  Date Value Ref Range Status  01/10/2023 14.5 13.2 - 17.1 g/dL Final   HCT  Date Value Ref Range Status  01/10/2023 43.7 38.5 - 50.0 % Final   MCHC  Date Value Ref Range Status  01/10/2023 33.2 32.0 - 36.0 g/dL Final   [facility]  Date Value Ref Range Status  01/10/2023 29.5 27.0 - 33.0 pg Final   MCV  Date Value Ref Range Status  01/10/2023 89.0 80.0 - 100.0 fL Final   No results found for: "PLTCOUNTKUC", "LABPLAT", "POCPLA" RDW  Date Value Ref Range Status  01/10/2023 12.6 11.0 - 15.0 % Final

## 2023-01-30 NOTE — Telephone Encounter (Signed)
Hello, Ricky Lowe  Can you give me update on the order # [id number] , pt has not yet heard from anyone yet.   Thank you.

## 2023-02-19 ENCOUNTER — Ambulatory Visit (HOSPITAL_COMMUNITY): Payer: Federal, State, Local not specified - PPO

## 2023-02-21 ENCOUNTER — Ambulatory Visit (HOSPITAL_COMMUNITY)
Admission: RE | Admit: 2023-02-21 | Discharge: 2023-02-21 | Disposition: A | Discharge status: Home / Self Care | Payer: Federal, State, Local not specified - PPO | Source: Ambulatory Visit | Attending: Family Medicine | Admitting: Family Medicine

## 2023-02-21 DIAGNOSIS — E118 Type 2 diabetes mellitus with unspecified complications: Secondary | ICD-10-CM | POA: Insufficient documentation

## 2023-05-10 ENCOUNTER — Ambulatory Visit (INDEPENDENT_AMBULATORY_CARE_PROVIDER_SITE_OTHER): Payer: Medicare Other | Admitting: Family Medicine

## 2023-05-10 VITALS — BP 120/72 | HR 68 | Temp 97.9°F | Ht 71.0 in | Wt 220.6 lb

## 2023-05-10 DIAGNOSIS — E1169 Type 2 diabetes mellitus with other specified complication: Secondary | ICD-10-CM

## 2023-05-10 DIAGNOSIS — Z7984 Long term (current) use of oral hypoglycemic drugs: Secondary | ICD-10-CM

## 2023-05-10 DIAGNOSIS — I10 Essential (primary) hypertension: Secondary | ICD-10-CM

## 2023-05-10 DIAGNOSIS — E118 Type 2 diabetes mellitus with unspecified complications: Secondary | ICD-10-CM

## 2023-05-10 NOTE — Progress Notes (Signed)
Subjective:    Patient ID: Ricky Lowe, male    DOB: 1950/04/27, 73 y.o.   MRN: [id number]  HPI 01/10/23 Patient is a very pleasant 73 year old patient, with history of diabetes mellitus type 2.  History of smoking blood pressure elevated in office 160.  He denies any polyuria polydipsia or blurry vision.  He is due for a PSA to screen for prostate cancer.  His last colonoscopy was in 2021 and was significant for tubular adenoma.  They recommended repeat colonoscopy in 2026.  He denies any chest pain shortness of breath or dyspnea on exertion.  Diabetic foot exam was performed today and was normal.  He is due for flu shot.  He is due for a COVID-vaccine.  He declines a shingles shot.  At that time, my plan was Physical exam today is normal.  Blood pressure is excellent.  Patient declines the shingles vaccine.  He received his flu shot today.  He does report increased urinary frequency.  I am concerned that he may have BPH.  I would like to screen for prostate cancer with PSA.  Colon cancer screening is up-to-date.  Check a hemoglobin A1c.  If elevated, I plan to increase metformin to 2000 mg a day.  Check a fasting lipid panel and also check a urine protein to creatinine ratio.  We discussed today CT cardiac scoring and the patient would like to proceed with this to ascertain whether he should take a higher dose statin given his history of diabetes.  Ideally I would like his LDL to be below 100.  05/10/23  After the last visit, HgA1c was elevated at 7.8.  I recommended increasing metformin to 1000 BID AND add jardiance 25 mg poqday as there was kidney damage seen in his urine sample.  Coronary artery calcium score put him in the 19th percentile for age and sex.  Small plaque in LAD.  Patient states that since he started the metformin and the Jardiance his blood sugars have been averaging around 105 which is outstanding.  He denies any side effects or complications from the [place].  Specifically he  denies any urinary tract infections or yeast infections.  He is able to afford the medication.  It is costing him roughly $1 a day.  Past Medical History:  Diagnosis Date   Arthritis    Cataract    Colon polyps    Dr. Glean Hess   Colon polyps    Complication of anesthesia    Diabetes mellitus without complication (HCC)    Dyslipidemia    Hypertension    PONV (postoperative nausea and vomiting)    Prediabetes    Past Surgical History:  Procedure Laterality Date   CATARACT EXTRACTION W/PHACO Left 12/21/2016   Procedure: CATARACT EXTRACTION PHACO AND INTRAOCULAR LENS PLACEMENT (IOC);  Surgeon: Fabio Pierce, MD;  Location: [facility];  Service: Ophthalmology;  Laterality: Left;  CDE: 2.63   CATARACT EXTRACTION W/PHACO Right 01/18/2017   Procedure: CATARACT EXTRACTION PHACO AND INTRAOCULAR LENS PLACEMENT RIGHT EYE;  Surgeon: Fabio Pierce, MD;  Location: [facility];  Service: Ophthalmology;  Laterality: Right;  CDE: 4.88   COLONOSCOPY N/A 05/06/2019   Procedure: COLONOSCOPY;  Surgeon: Corbin Ade, MD;  [facility];  Service: Endoscopy;  Laterality: N/A;  10:30   JOINT REPLACEMENT  2010 / 2012   rt hip / lt hip   POLYPECTOMY  05/06/2019   Procedure: POLYPECTOMY;  Surgeon: Corbin Ade, MD;  [facility];  Service: Endoscopy;;  Current Outpatient Medications on File Prior to Visit  Medication Sig Dispense Refill   aspirin 81 MG tablet Take 81 mg by mouth daily.     empagliflozin (JARDIANCE) 25 MG TABS tablet Take 1 tablet (25 mg total) by mouth daily before breakfast. 30 tablet 3   lisinopril (ZESTRIL) 40 MG tablet TAKE 1 TABLET(40 MG) BY MOUTH DAILY 90 tablet 3   metFORMIN (GLUCOPHAGE) 1000 MG tablet Take 1 tablet (1,000 mg total) by mouth 2 (two) times daily with a meal. 180 tablet 3   rosuvastatin (CRESTOR) 5 MG tablet TAKE 1 TABLET(5 MG) BY MOUTH DAILY 90 tablet 1   sildenafil (VIAGRA) 100 MG tablet Take 0.5-1 tablets (50-100 mg total) by mouth daily as needed for  erectile dysfunction. 5 tablet 11   No current facility-administered medications on file prior to visit.   No Known Allergies Social History   Socioeconomic History   Marital status: Married    Spouse name: Not on file   Number of children: Not on file   Years of education: Not on file   Highest education level: Not on file  Occupational History   Not on file  Tobacco Use   Smoking status: Never   Smokeless tobacco: Former    Quit date: 07/19/1989  Vaping Use   Vaping status: Never Used  Substance and Sexual Activity   Alcohol use: Yes   Drug use: No   Sexual activity: Yes    Birth control/protection: None  Other Topics Concern   Not on file  Social History Narrative   Not on file   Social Drivers of Health   Financial Resource Strain: Not on file  Food Insecurity: No Food Insecurity (08/11/2021)   Hunger Vital Sign    Worried About Running Out of Food in the Last Year: Never true    Ran Out of Food in the Last Year: Never true  Transportation Needs: No Transportation Needs (08/11/2021)   PRAPARE - Administrator, Civil Service (Medical): No    Lack of Transportation (Non-Medical): No  Physical Activity: Not on file  Stress: Not on file  Social Connections: Not on file  Intimate Partner Violence: Not on file   Family History  Problem Relation Age of Onset   Diabetes Mother    Hearing loss Mother    Heart disease Mother    Hyperlipidemia Mother    Stroke Mother    Vision loss Mother    Hypertension Father    Arthritis Maternal Grandmother       Review of Systems  All other systems reviewed and are negative.      Objective:   Physical Exam Vitals reviewed.  Constitutional:      General: He is not in acute distress.    Appearance: Normal appearance. He is well-developed. He is obese. He is not ill-appearing, toxic-appearing or diaphoretic.  HENT:     Head: Normocephalic and atraumatic.  Eyes:     General: No visual field deficit or  scleral icterus. Neck:     Thyroid: No thyromegaly.     Vascular: No JVD.     Trachea: No tracheal deviation.  Cardiovascular:     Rate and Rhythm: Normal rate and regular rhythm.     Heart sounds: Normal heart sounds. No murmur heard.    No friction rub. No gallop.  Pulmonary:     Effort: Pulmonary effort is normal. No respiratory distress.     Breath sounds: Normal breath sounds. No stridor.  No wheezing or rales.  Chest:     Chest wall: No tenderness.  Abdominal:     General: Bowel sounds are normal.     Palpations: Abdomen is soft.  Musculoskeletal:        General: No tenderness.     Right lower leg: No edema.     Left lower leg: No edema.  Neurological:     General: No focal deficit present.     Mental Status: He is alert and oriented to person, place, and time. Mental status is at baseline.     Cranial Nerves: No cranial nerve deficit, dysarthria or facial asymmetry.     Sensory: Sensation is intact.     Motor: Motor function is intact. No tremor or abnormal muscle tone.     Coordination: Coordination is intact. Romberg sign negative. Finger-Nose-Finger Test and Heel to [facility] Test normal. Rapid alternating movements normal.     Gait: Gait is intact.     Deep Tendon Reflexes: Reflexes are normal and symmetric.  Psychiatric:        Behavior: Behavior normal.        Thought Content: Thought content normal.        Judgment: Judgment normal.           Assessment & Plan:  Controlled type 2 diabetes mellitus with complication, without long-term current use of insulin (HCC) - Plan: Hemoglobin A1c, COMPLETE METABOLIC PANEL WITH GFR, Lipid panel, Microalbumin/Creatinine Ratio, Urine  Benign essential HTN I am very happy with his blood sugar readings.  Repeat a hemoglobin A1c.  Ideally I like to see his A1c less than 6.5.  Also plan to repeat a urine protein to creatinine ratio.  Hopefully this is improving with his improved glycemic control and the addition of Jardiance.   Recheck in 6 months

## 2023-05-11 LAB — COMPLETE METABOLIC PANEL WITH GFR
AG Ratio: 2.1 (calc) (ref 1.0–2.5)
ALT: 27 U/L (ref 9–46)
AST: 20 U/L (ref 10–35)
Albumin: 4.8 g/dL (ref 3.6–5.1)
Alkaline phosphatase (APISO): 41 U/L (ref 35–144)
BUN: 17 mg/dL (ref 7–25)
CO2: 26 mmol/L (ref 20–32)
Calcium: 9.4 mg/dL (ref 8.6–10.3)
Chloride: 101 mmol/L (ref 98–110)
Creat: 1 mg/dL (ref 0.70–1.28)
Globulin: 2.3 g/dL (ref 1.9–3.7)
Glucose, Bld: 112 mg/dL — ABNORMAL HIGH (ref 65–99)
Potassium: 4.6 mmol/L (ref 3.5–5.3)
Sodium: 137 mmol/L (ref 135–146)
Total Bilirubin: 0.4 mg/dL (ref 0.2–1.2)
Total Protein: 7.1 g/dL (ref 6.1–8.1)
eGFR: 81 mL/min/{1.73_m2} (ref >=60)

## 2023-05-11 LAB — LIPID PANEL
Cholesterol: 214 mg/dL — ABNORMAL HIGH (ref <200)
HDL: 45 mg/dL (ref >=40)
LDL Cholesterol (Calc): 139 mg/dL — ABNORMAL HIGH
Non-HDL Cholesterol (Calc): 169 mg/dL — ABNORMAL HIGH (ref <130)
Total CHOL/HDL Ratio: 4.8 (calc) (ref <5.0)
Triglycerides: 161 mg/dL — ABNORMAL HIGH (ref <150)

## 2023-05-11 LAB — MICROALBUMIN / CREATININE URINE RATIO
Creatinine, Urine: 82 mg/dL (ref 20–320)
Microalb Creat Ratio: 10 mg/g{creat} (ref <30)
Microalb, Ur: 0.8 mg/dL

## 2023-05-11 LAB — HEMOGLOBIN A1C
Hgb A1c MFr Bld: 6.1 %{Hb} — ABNORMAL HIGH (ref <5.7)
Mean Plasma Glucose: 128 mg/dL
eAG (mmol/L): 7.1 mmol/L

## 2023-05-13 ENCOUNTER — Other Ambulatory Visit: Payer: Self-pay | Admitting: Family Medicine

## 2023-05-13 DIAGNOSIS — E7800 Pure hypercholesterolemia, unspecified: Secondary | ICD-10-CM

## 2023-05-13 MED ORDER — ROSUVASTATIN CALCIUM 5 MG PO TABS: ORAL_TABLET | ORAL | 3 refills | Status: DC

## 2023-05-18 ENCOUNTER — Other Ambulatory Visit: Payer: Self-pay | Admitting: Family Medicine

## 2023-05-19 ENCOUNTER — Other Ambulatory Visit: Payer: Self-pay | Admitting: Family Medicine

## 2023-05-19 DIAGNOSIS — E7800 Pure hypercholesterolemia, unspecified: Secondary | ICD-10-CM

## 2023-05-20 NOTE — Telephone Encounter (Signed)
Requested by interface surescripts. Last OV 05/10/23. Last OV note is for patient to return in 6 months. Last labs 05/10/23. Requested Prescriptions  Pending Prescriptions Disp Refills   JARDIANCE 25 MG TABS tablet [Pharmacy Med Name: JARDIANCE 25MG  TABLETS] 30 tablet 3    Sig: TAKE 1 TABLET(25 MG) BY MOUTH DAILY BEFORE BREAKFAST     Endocrinology:  Diabetes - SGLT2 Inhibitors Failed - 05/20/2023  4:03 PM      Failed - Valid encounter within last 6 months    Recent Outpatient Visits           2 years ago Pure hypercholesterolemia   [facility] Pickard, Priscille Heidelberg, MD   2 years ago Encounter for initial annual wellness visit (AWV) in Medicare patient   [facility] Valentino Nose, NP   3 years ago Concussion without loss of consciousness, initial encounter   [facility] Donita Brooks, MD   3 years ago Tick bite, initial encounter   [facility] Elmore Guise, FNP   4 years ago Polyp of colon, unspecified part of colon, unspecified type   [facility] Pickard, Priscille Heidelberg, MD              Passed - Cr in normal range and within 360 days    Creat  Date Value Ref Range Status  05/10/2023 1.00 0.70 - 1.28 mg/dL Final   Creatinine, Urine  Date Value Ref Range Status  05/10/2023 82 20 - 320 mg/dL Final         Passed - HBA1C is between 0 and 7.9 and within 180 days    Hgb A1c MFr Bld  Date Value Ref Range Status  05/10/2023 6.1 (H) <5.7 % of total Hgb Final    Comment:    For someone without known diabetes, a hemoglobin  A1c value between 5.7% and 6.4% is consistent with prediabetes and should be confirmed with a  follow-up test. . For someone with known diabetes, a value <7% indicates that their diabetes is well controlled. A1c targets should be individualized based on duration of diabetes, age, comorbid conditions, and other considerations. . This assay result is consistent with an  increased risk of diabetes. . Currently, no consensus exists regarding use of hemoglobin A1c for diagnosis of diabetes for children. .          Passed - eGFR in normal range and within 360 days    GFR, Est African American  Date Value Ref Range Status  12/23/2018 79 > OR = 60 mL/min/1.85m2 Final   GFR calc Af Amer  Date Value Ref Range Status  12/09/2019 >60 >60 mL/min Final   GFR, Est Non African American  Date Value Ref Range Status  12/23/2018 69 > OR = 60 mL/min/1.9m2 Final   GFR calc non Af Amer  Date Value Ref Range Status  12/09/2019 >60 >60 mL/min Final   eGFR  Date Value Ref Range Status  05/10/2023 81 > OR = 60 mL/min/1.32m2 Final

## 2023-09-15 ENCOUNTER — Other Ambulatory Visit: Payer: Self-pay | Admitting: Family Medicine

## 2023-11-07 ENCOUNTER — Ambulatory Visit: Payer: Federal, State, Local not specified - PPO | Admitting: Family Medicine

## 2023-11-20 DIAGNOSIS — M545 Low back pain, unspecified: Secondary | ICD-10-CM | POA: Insufficient documentation

## 2023-12-05 ENCOUNTER — Encounter (INDEPENDENT_AMBULATORY_CARE_PROVIDER_SITE_OTHER): Payer: Medicare Other | Admitting: Ophthalmology

## 2023-12-05 DIAGNOSIS — I10 Essential (primary) hypertension: Secondary | ICD-10-CM

## 2023-12-05 DIAGNOSIS — H43813 Vitreous degeneration, bilateral: Secondary | ICD-10-CM

## 2023-12-05 DIAGNOSIS — D3132 Benign neoplasm of left choroid: Secondary | ICD-10-CM

## 2023-12-05 DIAGNOSIS — H35371 Puckering of macula, right eye: Secondary | ICD-10-CM

## 2023-12-05 DIAGNOSIS — H35033 Hypertensive retinopathy, bilateral: Secondary | ICD-10-CM | POA: Diagnosis not present

## 2024-01-08 ENCOUNTER — Ambulatory Visit

## 2024-01-08 VITALS — Ht 71.0 in | Wt 220.0 lb

## 2024-01-08 DIAGNOSIS — Z0000 Encounter for general adult medical examination without abnormal findings: Secondary | ICD-10-CM

## 2024-01-08 NOTE — Patient Instructions (Signed)
 Mr. Ricky Lowe,  Thank you for taking the time for your Medicare Wellness Visit. I appreciate your continued commitment to your health goals. Please review the care plan we discussed, and feel free to reach out if I can assist you further.  Medicare recommends these wellness visits once per year to help you and your care team stay ahead of potential health issues. These visits are designed to focus on prevention, allowing your provider to concentrate on managing your acute and chronic conditions during your regular appointments.  Please note that Annual Wellness Visits do not include a physical exam. Some assessments may be limited, especially if the visit was conducted virtually. If needed, we may recommend a separate in-person follow-up with your provider.  Ongoing Care Seeing your primary care provider every 3 to 6 months helps us  monitor your health and provide consistent, personalized care.   Referrals If a referral was made during today's visit and you haven't received any updates within two weeks, please contact the referred provider directly to check on the status.  Recommended Screenings:  Health Maintenance  Topic Date Due   Zoster (Shingles) Vaccine (1 of 2) Never done   Flu Shot  11/15/2023   COVID-19 Vaccine (4 - 2025-26 season) 12/16/2023   Colon Cancer Screening  05/05/2024   Yearly kidney function blood test for diabetes  05/09/2024   Yearly kidney health urinalysis for diabetes  05/09/2024   Medicare Annual Wellness Visit  01/07/2025   DTaP/Tdap/Td vaccine (3 - Td or Tdap) 09/03/2027   Pneumococcal Vaccine for age over 39  Completed   Hepatitis C Screening  Completed   HPV Vaccine  Aged Out   Meningitis B Vaccine  Aged Out       01/08/2024   11:49 AM  Advanced Directives  Does Patient Have a Medical Advance Directive? No  Would patient like information on creating a medical advance directive? Yes (MAU/Ambulatory/Procedural Areas - Information given)   Advance Care  Planning is important because it: Ensures you receive medical care that aligns with your values, goals, and preferences. Provides guidance to your family and loved ones, reducing the emotional burden of decision-making during critical moments.  Information on Advanced Care Planning can be found at [place]  Secretary of [facility] Advance Health Care Directives Advance Health Care Directives ([email or web address]   Vision: Annual vision screenings are recommended for early detection of glaucoma, cataracts, and diabetic retinopathy. These exams can also reveal signs of chronic conditions such as diabetes and high blood pressure.  Dental: Annual dental screenings help detect early signs of oral cancer, gum disease, and other conditions linked to overall health, including heart disease and diabetes.  Please see the attached documents for additional preventive care recommendations.

## 2024-01-08 NOTE — Progress Notes (Signed)
 Subjective:   Ricky Lowe is a 73 y.o. who presents for a Medicare Wellness preventive visit.  As a reminder, Annual Wellness Visits don't include a physical exam, and some assessments may be limited, especially if this visit is performed virtually. We may recommend an in-person follow-up visit with your provider if needed.  Visit Complete: Virtual I connected with  Wolm Blush on 01/08/24 by a audio enabled telemedicine application and verified that I am speaking with the correct person using two identifiers.  Patient Location: Home  Provider Location: Home Office  I discussed the limitations of evaluation and management by telemedicine. The patient expressed understanding and agreed to proceed.  Vital Signs: Because this visit was a virtual/telehealth visit, some criteria may be missing or patient reported. Any vitals not documented were not able to be obtained and vitals that have been documented are patient reported.  VideoDeclined- This patient declined Librarian, academic. Therefore the visit was completed with audio only.  Persons Participating in Visit: Patient.  AWV Questionnaire: Yes: Patient Medicare AWV questionnaire was completed by the patient on 01/04/24; I have confirmed that all information answered by patient is correct and no changes since this date.  Cardiac Risk Factors include: advanced age (>4men, >88 women);male gender;hypertension;dyslipidemia     Objective:    Today's Vitals   01/08/24 1144  Weight: 220 lb (99.8 kg)  Height: 5' 11 (1.803 m)   Body mass index is 30.68 kg/m.     01/08/2024   11:49 AM 12/09/2019    4:46 PM 05/06/2019    9:42 AM 01/18/2017    9:17 AM 12/21/2016    9:18 AM 12/14/2016    9:23 AM  Advanced Directives  Does Patient Have a Medical Advance Directive? No No No No  No  No   Would patient like information on creating a medical advance directive? Yes (MAU/Ambulatory/Procedural Areas - Information  given) No - Patient declined No - Patient declined  No - Patient declined  Yes (MAU/Ambulatory/Procedural Areas - Information given)      Data saved with a previous flowsheet row definition    Current Medications (verified) Outpatient Encounter Medications as of 01/08/2024  Medication Sig   aspirin 81 MG tablet Take 81 mg by mouth daily.   cyclobenzaprine (FLEXERIL) 10 MG tablet Take 10 mg by mouth at bedtime as needed.   JARDIANCE  25 MG TABS tablet TAKE 1 TABLET(25 MG) BY MOUTH DAILY BEFORE BREAKFAST   lisinopril  (ZESTRIL ) 40 MG tablet TAKE 1 TABLET(40 MG) BY MOUTH DAILY   metFORMIN  (GLUCOPHAGE ) 1000 MG tablet Take 1 tablet (1,000 mg total) by mouth 2 (two) times daily with a meal.   rosuvastatin  (CRESTOR ) 5 MG tablet TAKE 1 TABLET(5 MG) BY MOUTH DAILY   sildenafil  (VIAGRA ) 100 MG tablet Take 0.5-1 tablets (50-100 mg total) by mouth daily as needed for erectile dysfunction.   No facility-administered encounter medications on file as of 01/08/2024.    Allergies (verified) Patient has no known allergies.   History: Past Medical History:  Diagnosis Date   Arthritis    Cataract    Colon polyps    Dr. Jennifer   Colon polyps    Complication of anesthesia    Diabetes mellitus without complication (HCC)    Dyslipidemia    Hypertension    PONV (postoperative nausea and vomiting)    Prediabetes    Past Surgical History:  Procedure Laterality Date   CATARACT EXTRACTION W/PHACO Left 12/21/2016   Procedure: CATARACT EXTRACTION PHACO  AND INTRAOCULAR LENS PLACEMENT (IOC);  Surgeon: Harrie Agent, MD;  Location: [facility];  Service: Ophthalmology;  Laterality: Left;  CDE: 2.63   CATARACT EXTRACTION W/PHACO Right 01/18/2017   Procedure: CATARACT EXTRACTION PHACO AND INTRAOCULAR LENS PLACEMENT RIGHT EYE;  Surgeon: Harrie Agent, MD;  Location: [facility];  Service: Ophthalmology;  Laterality: Right;  CDE: 4.88   COLONOSCOPY N/A 05/06/2019   Procedure: COLONOSCOPY;  Surgeon: Shaaron Lamar HERO, MD;   Location: [facility];  Service: Endoscopy;  Laterality: N/A;  10:30   JOINT REPLACEMENT  2010 / 2012   rt hip / lt hip   POLYPECTOMY  05/06/2019   Procedure: POLYPECTOMY;  Surgeon: Shaaron Lamar HERO, MD;  Location: [facility];  Service: Endoscopy;;   Family History  Problem Relation Age of Onset   Diabetes Mother    Hearing loss Mother    Heart disease Mother    Hyperlipidemia Mother    Stroke Mother    Vision loss Mother    Hypertension Father    Arthritis Maternal Grandmother    Social History   Socioeconomic History   Marital status: Married    Spouse name: Not on file   Number of children: Not on file   Years of education: Not on file   Highest education level: Bachelor's degree (e.g., BA, AB, BS)  Occupational History   Not on file  Tobacco Use   Smoking status: Never   Smokeless tobacco: Former    Quit date: 07/19/1989  Vaping Use   Vaping status: Never Used  Substance and Sexual Activity   Alcohol use: Not Currently   Drug use: No   Sexual activity: Yes    Birth control/protection: None  Other Topics Concern   Not on file  Social History Narrative   Not on file   Social Drivers of Health   Financial Resource Strain: Low Risk  (01/04/2024)   Overall Financial Resource Strain (CARDIA)    Difficulty of Paying Living Expenses: Not very hard  Food Insecurity: No Food Insecurity (01/04/2024)   Hunger Vital Sign    Worried About Running Out of Food in the Last Year: Never true    Ran Out of Food in the Last Year: Never true  Transportation Needs: No Transportation Needs (01/04/2024)   PRAPARE - Administrator, Civil Service (Medical): No    Lack of Transportation (Non-Medical): No  Physical Activity: Insufficiently Active (01/04/2024)   Exercise Vital Sign    Days of Exercise per Week: 2 days    Minutes of Exercise per Session: 30 min  Stress: No Stress Concern Present (01/04/2024)   [employer] of Occupational Health - Occupational Stress  Questionnaire    Feeling of Stress: Not at all  Social Connections: Socially Integrated (01/04/2024)   Social Connection and Isolation Panel    Frequency of Communication with Friends and Family: More than three times a week    Frequency of Social Gatherings with Friends and Family: Once a week    Attends Religious Services: More than 4 times per year    Active Member of [employer] or Organizations: Yes    Attends Engineer, structural: More than 4 times per year    Marital Status: Married    Tobacco Counseling Counseling given: Not Answered    Clinical Intake:  Pre-visit preparation completed: Yes  Pain : No/denies pain  Diabetes: No  Lab Results  Component Value Date   HGBA1C 6.1 (H) 05/10/2023   HGBA1C 7.8 (H)  01/10/2023   HGBA1C 7.0 (H) 01/08/2022     How often do you need to have someone help you when you read instructions, pamphlets, or other written materials from your doctor or pharmacy?: 1 - Never  Interpreter Needed?: No  Information entered by :: Charmaine Bloodgood LPN   Activities of Daily Living     01/08/2024   11:45 AM  In your present state of health, do you have any difficulty performing the following activities:  Hearing? 0  Vision? 0  Difficulty concentrating or making decisions? 0  Walking or climbing stairs? 0  Dressing or bathing? 0  Doing errands, shopping? 0  Preparing Food and eating ? N  Using the Toilet? N  In the past six months, have you accidently leaked urine? N  Do you have problems with loss of bowel control? N  Managing your Medications? N  Managing your Finances? N  Housekeeping or managing your Housekeeping? N    Patient Care Team: Duanne Butler DASEN, MD as PCP - General (Family Medicine) Shaaron Lamar HERO, MD as Consulting Physician (Gastroenterology) Alvia Norleen BIRCH, MD as Consulting Physician (Ophthalmology) Johnson Reusing, PA-C as Physician Assistant (Orthopedic Surgery) [facility], [facility]  I have updated your Care Teams any recent Medical Services you may have received from other providers in the past year.     Assessment:   This is a routine wellness examination for Ricky Lowe.  Hearing/Vision screen Hearing Screening - Comments:: Denies hearing difficulties   Vision Screening - Comments:: Wears rx glasses - up to date with routine eye exams with [facility].    Goals Addressed             This Visit's Progress    Maintain health and independence   On track      Depression Screen     01/08/2024   11:46 AM 05/10/2023    9:59 AM 01/10/2023    8:22 AM 01/08/2022   11:58 AM 08/11/2021    1:42 PM 08/19/2020   12:32 PM 12/25/2018    2:51 PM  PHQ 2/9 Scores  PHQ - 2 Score 0 0 0 0 0 0 0    Fall Risk     01/08/2024   11:49 AM 05/10/2023    9:59 AM 01/10/2023    8:22 AM 01/08/2022    9:19 AM 01/04/2022    9:17 AM  Fall Risk   Falls in the past year? 0 0 1 0 0  Number falls in past yr: 0 0 0    Injury with Fall? 0 0 1    Risk for fall due to : No Fall Risks No Fall Risks History of fall(s)    Follow up Falls prevention discussed;Education provided;Falls evaluation completed Falls prevention discussed Education provided;Falls prevention discussed;Falls evaluation completed      MEDICARE RISK AT HOME:  Medicare Risk at Home Any stairs in or around the home?: No If so, are there any without handrails?: No Home free of loose throw rugs in walkways, pet beds, electrical cords, etc?: Yes Adequate lighting in your home to reduce risk of falls?: Yes Life alert?: No Use of a cane, walker or w/c?: No Grab bars in the bathroom?: Yes Shower chair or bench in shower?: No Elevated toilet seat or a handicapped toilet?: No  TIMED UP AND GO:  Was the test performed?  No  Cognitive Function: 6CIT completed        01/08/2024   11:50 AM 08/19/2020  12:34 PM  6CIT Screen  What Year? 0 points 0 points  What month? 0 points 0 points  What time? 0 points 0 points  Count  back from 20 0 points 0 points  Months in reverse 0 points 0 points  Repeat phrase 0 points 0 points  Total Score 0 points 0 points    Immunizations Immunization History  Administered Date(s) Administered   Fluad Quad(high Dose 65+) 12/25/2018   Fluad Trivalent(High Dose 65+) 01/10/2023   Hepatitis A, Adult 09/02/2017   Influenza,inj,Quad PF,6+ Mos 01/27/2016, 02/10/2017, 12/13/2020, 01/08/2022   Influenza-Unspecified 02/10/2017   PFIZER(Purple Top)SARS-COV-2 Vaccination 06/09/2019, 06/30/2019, 05/06/2020   Pneumococcal Conjugate-13 12/25/2018   Pneumococcal Polysaccharide-23 12/13/2020   Tdap 04/16/2013, 09/02/2017    Screening Tests Health Maintenance  Topic Date Due   Zoster Vaccines- Shingrix (1 of 2) Never done   Influenza Vaccine  11/15/2023   COVID-19 Vaccine (4 - 2025-26 season) 12/16/2023   Colonoscopy  05/05/2024   Diabetic kidney evaluation - eGFR measurement  05/09/2024   Diabetic kidney evaluation - Urine ACR  05/09/2024   Medicare Annual Wellness (AWV)  01/07/2025   DTaP/Tdap/Td (3 - Td or Tdap) 09/03/2027   Pneumococcal Vaccine: 50+ Years  Completed   Hepatitis C Screening  Completed   HPV VACCINES  Aged Out   Meningococcal B Vaccine  Aged Out    Health Maintenance Items Addressed: Information provided on vaccine recommendations   Additional Screening:  Vision Screening: Recommended annual ophthalmology exams for early detection of glaucoma and other disorders of the eye. Is the patient up to date with their annual eye exam?  Yes  Who is the provider or what is the name of the office in which the patient attends annual eye exams? [facility] [place]   Dental Screening: Recommended annual dental exams for proper oral hygiene  Community Resource Referral / Chronic Care Management: CRR required this visit?  No   CCM required this visit?  Appt scheduled with PCP   Plan:    I have personally reviewed and noted the following in the patient's chart:    Medical and social history Use of alcohol, tobacco or illicit drugs  Current medications and supplements including opioid prescriptions. Patient is not currently taking opioid prescriptions. Functional ability and status Nutritional status Physical activity Advanced directives List of other physicians Hospitalizations, surgeries, and ER visits in previous 12 months Vitals Screenings to include cognitive, depression, and falls Referrals and appointments  In addition, I have reviewed and discussed with patient certain preventive protocols, quality metrics, and best practice recommendations. A written personalized care plan for preventive services as well as general preventive health recommendations were provided to patient.   Lavelle Pfeiffer [place], [place]   [id number]   After Visit Summary: (MyChart) Due to this being a telephonic visit, the after visit summary with patients personalized plan was offered to patient via MyChart   Notes: Nothing significant to report at this time.

## 2024-01-16 ENCOUNTER — Encounter: Payer: Self-pay | Admitting: Family Medicine

## 2024-01-16 ENCOUNTER — Ambulatory Visit: Admitting: Family Medicine

## 2024-01-16 ENCOUNTER — Other Ambulatory Visit: Payer: Self-pay | Admitting: Family Medicine

## 2024-01-16 VITALS — BP 128/70 | HR 62 | Temp 97.8°F | Ht 71.0 in | Wt 217.0 lb

## 2024-01-16 DIAGNOSIS — I10 Essential (primary) hypertension: Secondary | ICD-10-CM | POA: Diagnosis not present

## 2024-01-16 DIAGNOSIS — E118 Type 2 diabetes mellitus with unspecified complications: Secondary | ICD-10-CM

## 2024-01-16 DIAGNOSIS — Z23 Encounter for immunization: Secondary | ICD-10-CM

## 2024-01-16 DIAGNOSIS — Z7984 Long term (current) use of oral hypoglycemic drugs: Secondary | ICD-10-CM | POA: Diagnosis not present

## 2024-01-16 DIAGNOSIS — L989 Disorder of the skin and subcutaneous tissue, unspecified: Secondary | ICD-10-CM

## 2024-01-16 MED ORDER — LISINOPRIL 40 MG PO TABS
40.0000 mg | ORAL_TABLET | Freq: Every day | ORAL | 1 refills | Status: AC
Start: 2024-01-16 — End: ?

## 2024-01-16 MED ORDER — EMPAGLIFLOZIN 25 MG PO TABS: 25.0000 mg | ORAL_TABLET | Freq: Every day | ORAL | 1 refills | Status: DC

## 2024-01-16 NOTE — Progress Notes (Signed)
 Subjective:    Patient ID: Ricky Lowe, male    DOB: 06-02-1950, 73 y.o.   MRN: [id number]  HPI Patient states that there is an area in the center of his back that has been irritated recently.  It is sore and itching.  On examination in the center of his back roughly around the level of L1, there appears to be an irritated seborrheic keratosis.  It is roughly 1.5 cm long by 1 cm wide.  It is erythematous and swollen but there appears to be a brown wartlike papule underneath the inflammation.  Past Medical History:  Diagnosis Date   Arthritis    Cataract    Colon polyps    Dr. Jennifer   Colon polyps    Complication of anesthesia    Diabetes mellitus without complication (HCC)    Dyslipidemia    Hypertension    PONV (postoperative nausea and vomiting)    Prediabetes    Past Surgical History:  Procedure Laterality Date   CATARACT EXTRACTION W/PHACO Left 12/21/2016   Procedure: CATARACT EXTRACTION PHACO AND INTRAOCULAR LENS PLACEMENT (IOC);  Surgeon: Harrie Agent, MD;  Location: [facility];  Service: Ophthalmology;  Laterality: Left;  CDE: 2.63   CATARACT EXTRACTION W/PHACO Right 01/18/2017   Procedure: CATARACT EXTRACTION PHACO AND INTRAOCULAR LENS PLACEMENT RIGHT EYE;  Surgeon: Harrie Agent, MD;  Location: [facility];  Service: Ophthalmology;  Laterality: Right;  CDE: 4.88   COLONOSCOPY N/A 05/06/2019   Procedure: COLONOSCOPY;  Surgeon: Shaaron Lamar HERO, MD;  [facility];  Service: Endoscopy;  Laterality: N/A;  10:30   JOINT REPLACEMENT  2010 / 2012   rt hip / lt hip   POLYPECTOMY  05/06/2019   Procedure: POLYPECTOMY;  Surgeon: Shaaron Lamar HERO, MD;  [facility];  Service: Endoscopy;;   Current Outpatient Medications on File Prior to Visit  Medication Sig Dispense Refill   aspirin 81 MG tablet Take 81 mg by mouth daily.     cyclobenzaprine (FLEXERIL) 10 MG tablet Take 10 mg by mouth at bedtime as needed.     metFORMIN  (GLUCOPHAGE ) 1000 MG tablet Take 1 tablet (1,000  mg total) by mouth 2 (two) times daily with a meal. 180 tablet 3   rosuvastatin  (CRESTOR ) 5 MG tablet TAKE 1 TABLET(5 MG) BY MOUTH DAILY 30 tablet 0   sildenafil  (VIAGRA ) 100 MG tablet Take 0.5-1 tablets (50-100 mg total) by mouth daily as needed for erectile dysfunction. 5 tablet 11   No current facility-administered medications on file prior to visit.   No Known Allergies Social History   Socioeconomic History   Marital status: Married    Spouse name: Not on file   Number of children: Not on file   Years of education: Not on file   Highest education level: Bachelor's degree (e.g., BA, AB, BS)  Occupational History   Not on file  Tobacco Use   Smoking status: Never   Smokeless tobacco: Former    Quit date: 07/19/1989  Vaping Use   Vaping status: Never Used  Substance and Sexual Activity   Alcohol use: Not Currently   Drug use: No   Sexual activity: Yes    Birth control/protection: None  Other Topics Concern   Not on file  Social History Narrative   Not on file   Social Drivers of Health   Financial Resource Strain: Low Risk  (01/04/2024)   Overall Financial Resource Strain (CARDIA)    Difficulty of Paying Living Expenses: Not very hard  Food Insecurity: No Food Insecurity (01/04/2024)   Hunger Vital Sign    Worried About Running Out of Food in the Last Year: Never true    Ran Out of Food in the Last Year: Never true  Transportation Needs: No Transportation Needs (01/04/2024)   PRAPARE - Administrator, Civil Service (Medical): No    Lack of Transportation (Non-Medical): No  Physical Activity: Insufficiently Active (01/04/2024)   Exercise Vital Sign    Days of Exercise per Week: 2 days    Minutes of Exercise per Session: 30 min  Stress: No Stress Concern Present (01/04/2024)   [employer] of Occupational Health - Occupational Stress Questionnaire    Feeling of Stress: Not at all  Social Connections: Socially Integrated (01/04/2024)   Social Connection  and Isolation Panel    Frequency of Communication with Friends and Family: More than three times a week    Frequency of Social Gatherings with Friends and Family: Once a week    Attends Religious Services: More than 4 times per year    Active Member of [employer] or Organizations: Yes    Attends Engineer, structural: More than 4 times per year    Marital Status: Married  Catering manager Violence: Not At Risk (01/08/2024)   Humiliation, Afraid, Rape, and Kick questionnaire    Fear of Current or Ex-Partner: No    Emotionally Abused: No    Physically Abused: No    Sexually Abused: No   Family History  Problem Relation Age of Onset   Diabetes Mother    Hearing loss Mother    Heart disease Mother    Hyperlipidemia Mother    Stroke Mother    Vision loss Mother    Hypertension Father    Arthritis Maternal Grandmother       Review of Systems  All other systems reviewed and are negative.      Objective:   Physical Exam Vitals reviewed.  Constitutional:      General: He is not in acute distress.    Appearance: Normal appearance. He is well-developed. He is obese. He is not ill-appearing, toxic-appearing or diaphoretic.  HENT:     Head: Normocephalic and atraumatic.  Eyes:     General: No visual field deficit or scleral icterus. Neck:     Thyroid: No thyromegaly.     Vascular: No JVD.     Trachea: No tracheal deviation.  Cardiovascular:     Rate and Rhythm: Normal rate and regular rhythm.     Heart sounds: Normal heart sounds. No murmur heard.    No friction rub. No gallop.  Pulmonary:     Effort: Pulmonary effort is normal. No respiratory distress.     Breath sounds: Normal breath sounds. No stridor. No wheezing or rales.  Chest:     Chest wall: No tenderness.  Abdominal:     General: Bowel sounds are normal.     Palpations: Abdomen is soft.  Musculoskeletal:        General: No tenderness.     Right lower leg: No edema.     Left lower leg: No edema.   Neurological:     General: No focal deficit present.     Mental Status: He is alert and oriented to person, place, and time. Mental status is at baseline.     Cranial Nerves: No cranial nerve deficit, dysarthria or facial asymmetry.     Sensory: Sensation is intact.     Motor: Motor function  is intact. No tremor or abnormal muscle tone.     Coordination: Coordination is intact. Romberg sign negative. Finger-Nose-Finger Test and Heel to [facility] Test normal. Rapid alternating movements normal.     Gait: Gait is intact.     Deep Tendon Reflexes: Reflexes are normal and symmetric.  Psychiatric:        Behavior: Behavior normal.        Thought Content: Thought content normal.        Judgment: Judgment normal.           Assessment & Plan:  Controlled type 2 diabetes mellitus with complication, without long-term current use of insulin (HCC) - Plan: empagliflozin  (JARDIANCE ) 25 MG TABS tablet, Hemoglobin A1c, Lipid panel, Comprehensive metabolic panel with GFR, Flu vaccine HIGH DOSE PF(Fluzone Trivalent)  Benign essential HTN - Plan: lisinopril  (ZESTRIL ) 40 MG tablet  Skin lesion of back - Plan: Pathology Report ([facility])  Flu vaccine need - Plan: Flu vaccine HIGH DOSE PF(Fluzone Trivalent) I believe that this is most likely an irritated seborrheic keratosis.  Patient request a shave biopsy.  The area was anesthetized with 0.1% lidocaine  with epinephrine  and the lesion was removed using a shave biopsy technique.  Hemostasis was achieved with Drysol and a Band-Aid.  The lesion was sent to pathology in a labeled container.  While the patient is here today I will check fasting lab work including an A1c and lipid panel.  I would like to see his LDL cholesterol less than 899 and his A1c less than 6.5.  He states that his fasting blood sugars are typically under 120 and his 2-hour postprandial sugars are under 180

## 2024-01-17 ENCOUNTER — Ambulatory Visit: Payer: Self-pay | Admitting: Family Medicine

## 2024-01-17 LAB — COMPREHENSIVE METABOLIC PANEL WITH GFR
AG Ratio: 2.5 (calc) (ref 1.0–2.5)
ALT: 29 U/L (ref 9–46)
AST: 24 U/L (ref 10–35)
Albumin: 5 g/dL (ref 3.6–5.1)
Alkaline phosphatase (APISO): 33 U/L — ABNORMAL LOW (ref 35–144)
BUN: 14 mg/dL (ref 7–25)
CO2: 26 mmol/L (ref 20–32)
Calcium: 10 mg/dL (ref 8.6–10.3)
Chloride: 102 mmol/L (ref 98–110)
Creat: 0.95 mg/dL (ref 0.70–1.28)
Globulin: 2 g/dL (ref 1.9–3.7)
Glucose, Bld: 82 mg/dL (ref 65–99)
Potassium: 4.7 mmol/L (ref 3.5–5.3)
Sodium: 139 mmol/L (ref 135–146)
Total Bilirubin: 0.6 mg/dL (ref 0.2–1.2)
Total Protein: 7 g/dL (ref 6.1–8.1)
eGFR: 86 mL/min/1.73m2 (ref >=60)

## 2024-01-17 LAB — HEMOGLOBIN A1C
Hgb A1c MFr Bld: 5.8 % — ABNORMAL HIGH (ref <5.7)
Mean Plasma Glucose: 120 mg/dL
eAG (mmol/L): 6.6 mmol/L

## 2024-01-17 LAB — LIPID PANEL
Cholesterol: 148 mg/dL (ref <200)
HDL: 39 mg/dL — ABNORMAL LOW (ref >=40)
LDL Cholesterol (Calc): 81 mg/dL
Non-HDL Cholesterol (Calc): 109 mg/dL (ref <130)
Total CHOL/HDL Ratio: 3.8 (calc) (ref <5.0)
Triglycerides: 190 mg/dL — ABNORMAL HIGH (ref <150)

## 2024-01-20 LAB — PATHOLOGY REPORT

## 2024-01-20 LAB — TISSUE SPECIMEN

## 2024-02-13 ENCOUNTER — Other Ambulatory Visit: Payer: Self-pay | Admitting: Family Medicine

## 2024-03-18 ENCOUNTER — Encounter (INDEPENDENT_AMBULATORY_CARE_PROVIDER_SITE_OTHER): Payer: Self-pay | Admitting: *Deleted

## 2024-04-20 ENCOUNTER — Telehealth: Payer: Self-pay | Admitting: *Deleted

## 2024-04-20 NOTE — Telephone Encounter (Signed)
" °  Procedure: COLONOSCOPY  Height: 215lbs Weight: 5'11        Have you had a colonoscopy before?  04/2019, Dr. Shaaron  Do you have family history of colon cancer?  no  Do you have a family history of polyps? yes  Previous colonoscopy with polyps removed? yes  Do you have a history colorectal cancer?   no  Are you diabetic?  yes  Do you have a prosthetic or mechanical heart valve? no  Do you have a pacemaker/defibrillator?   no  Have you had endocarditis/atrial fibrillation?  no  Do you use supplemental oxygen/CPAP?  no  Have you had joint replacement within the last 12 months?  no  Do you tend to be constipated or have to use laxatives?  no   Do you have history of alcohol use? If yes, how much and how often.  no  Do you have history or are you using drugs? If yes, what do are you  using?  no  Have you ever had a stroke/heart attack?  no  Have you ever had a heart or other vascular stent placed,?no  Do you take weight loss medication? no  Do you take any blood-thinning medications such as: (Plavix, aspirin, Coumadin, Aggrenox, Brilinta, Xarelto, Eliquis, Pradaxa, Savaysa or Effient)? no  If yes we need the name, milligram, dosage and who is prescribing doctor:  \F2             Current Outpatient Medications  Medication Sig Dispense Refill   empagliflozin  (JARDIANCE ) 25 MG TABS tablet Take 1 tablet (25 mg total) by mouth daily. 90 tablet 1   lisinopril  (ZESTRIL ) 40 MG tablet Take 1 tablet (40 mg total) by mouth daily. 90 tablet 1   metFORMIN  (GLUCOPHAGE ) 1000 MG tablet TAKE 1 TABLET(1000 MG) BY MOUTH TWICE DAILY WITH A MEAL 180 tablet 3   rosuvastatin  (CRESTOR ) 5 MG tablet TAKE 1 TABLET(5 MG) BY MOUTH DAILY 30 tablet 0   No current facility-administered medications for this visit.    Allergies[1]  [facility] [address]       [1] No Known Allergies  "

## 2024-04-26 NOTE — Telephone Encounter (Signed)
 ASA 2 Hold jardiance  3 days prior Half dose metformin  night prior and hold morning of

## 2024-04-27 NOTE — Telephone Encounter (Signed)
 LMOVM to return call.

## 2024-04-30 NOTE — Telephone Encounter (Signed)
 LMOVM to return call.

## 2024-05-05 ENCOUNTER — Encounter: Payer: Self-pay | Admitting: *Deleted

## 2024-05-05 MED ORDER — PEG 3350-KCL-NA BICARB-NACL 420 G PO SOLR: 4000.0000 mL | Freq: Once | ORAL | 0 refills | Status: AC

## 2024-05-05 NOTE — Telephone Encounter (Signed)
 Pt left VM, called back, LMOVM to call back

## 2024-05-05 NOTE — Addendum Note (Signed)
 Addended by: GAYLENE MADELIN CROME on: 05/05/2024 11:29 AM   Modules accepted: Orders

## 2024-05-05 NOTE — Telephone Encounter (Signed)
 Questionnaire from recall, no referral needed

## 2024-05-05 NOTE — Telephone Encounter (Signed)
 Pt has been scheduled for 05/22/23. Instructions sent via mychart and prep sent to the pharmacy

## 2024-05-21 ENCOUNTER — Ambulatory Visit (HOSPITAL_COMMUNITY)
Admission: RE | Admit: 2024-05-21 | Discharge: 2024-05-21 | Disposition: A | Source: Home / Self Care | Attending: Internal Medicine | Admitting: Internal Medicine

## 2024-05-21 ENCOUNTER — Ambulatory Visit (HOSPITAL_COMMUNITY): Admitting: Anesthesiology

## 2024-05-21 ENCOUNTER — Encounter (HOSPITAL_COMMUNITY): Admission: RE | Disposition: A | Payer: Self-pay | Source: Home / Self Care | Attending: Internal Medicine

## 2024-05-21 ENCOUNTER — Encounter (HOSPITAL_COMMUNITY): Payer: Self-pay | Admitting: Internal Medicine

## 2024-05-21 ENCOUNTER — Other Ambulatory Visit: Payer: Self-pay | Admitting: Family Medicine

## 2024-05-21 ENCOUNTER — Other Ambulatory Visit: Payer: Self-pay

## 2024-05-21 DIAGNOSIS — E118 Type 2 diabetes mellitus with unspecified complications: Secondary | ICD-10-CM

## 2024-05-21 LAB — GLUCOSE, CAPILLARY: Glucose-Capillary: 145 mg/dL — ABNORMAL HIGH (ref 70–99)

## 2024-05-21 MED ORDER — PROPOFOL 500 MG/50ML IV EMUL
INTRAVENOUS | Status: DC | PRN
  Administered 2024-05-21: 100 ug/kg/min via INTRAVENOUS
  Administered 2024-05-21: 120 mg via INTRAVENOUS

## 2024-05-21 MED ORDER — LACTATED RINGERS IV SOLN: INTRAVENOUS | Status: DC | PRN

## 2024-05-21 MED ORDER — PHENYLEPHRINE 80 MCG/ML (10ML) SYRINGE FOR IV PUSH (FOR BLOOD PRESSURE SUPPORT)
PREFILLED_SYRINGE | INTRAVENOUS | Status: DC | PRN
  Administered 2024-05-21 (×9): 80 ug via INTRAVENOUS

## 2024-05-21 NOTE — Anesthesia Postprocedure Evaluation (Signed)
"   Anesthesia Post Note  Patient: Ricky Lowe  Procedure(s) Performed: COLONOSCOPY COLONOSCOPY, WITH POLYPECTOMY  Patient location during evaluation: Phase II Anesthesia Type: MAC Level of consciousness: awake Pain management: pain level controlled Vital Signs Assessment: post-procedure vital signs reviewed and stable Respiratory status: spontaneous breathing and respiratory function stable Cardiovascular status: blood pressure returned to baseline and stable Postop Assessment: no headache and no apparent nausea or vomiting Anesthetic complications: no Comments: Late entry   No notable events documented.   Last Vitals:  Vitals:   05/21/24 1129 05/21/24 1133  BP: (!) 84/47 (!) 103/59  Pulse: 71 71  Resp: 11 15  Temp: 36.5 C   SpO2: 96% 96%    Last Pain:  Vitals:   05/21/24 1129  TempSrc: Oral  PainSc: 0-No pain                 Yvonna PARAS Akai Dollard      "

## 2024-05-21 NOTE — Op Note (Signed)
 [facility] Patient Name: Ricky Lowe Procedure Date: 05/21/2024 10:36 AM MRN: [id number] Date of Birth: July 31, 1950 Attending MD: Ricky Ozell Hollingshead , MD, [id number] CSN: [id number] Age: 74 Admit Type: Outpatient Procedure:                Colonoscopy Indications:              High risk colon cancer surveillance: Personal                            history of colonic polyps Providers:                Ricky Ozell Hollingshead, MD, Rosina Sprague, Bascom Blush Referring MD:              Medicines:                Propofol  per Anesthesia Complications:            No immediate complications. Estimated Blood Loss:     Estimated blood loss was minimal. Procedure:                Pre-Anesthesia Assessment:                           - Prior to the procedure, a History and Physical                            was performed, and patient medications and                            allergies were reviewed. The patient's tolerance of                            previous anesthesia was also reviewed. The risks                            and benefits of the procedure and the sedation                            options and risks were discussed with the patient.                            All questions were answered, and informed consent                            was obtained. Prior Anticoagulants: The patient has                            taken no anticoagulant or antiplatelet agents. ASA                            Grade Assessment: III - A patient with severe                            systemic disease. After reviewing the risks and  benefits, the patient was deemed in satisfactory                            condition to undergo the procedure.                           After obtaining informed consent, the colonoscope                            was passed under direct vision. Throughout the                            procedure, the patient's blood pressure, pulse, and                             oxygen saturations were monitored continuously. The                            [id number] ([id number]) Colon was introduced through                            the anus and advanced to the the cecum, identified                            by appendiceal orifice and ileocecal valve. The                            colonoscopy was performed without difficulty. The                            patient tolerated the procedure well. The quality                            of the bowel preparation was adequate. The entire                            colon was well visualized. Scope In: 11:06:18 AM Scope Out: 11:24:10 AM Scope Withdrawal Time: 0 hours 11 minutes 45 seconds  Total Procedure Duration: 0 hours 17 minutes 52 seconds  Findings:      The perianal and digital rectal examinations were normal.      Two semi-sessile polyps were found in the descending colon and ascending       colon. The polyps were 5 to 6 mm in size. These polyps were removed with       a cold snare. Resection and retrieval were complete. Estimated blood       loss was minimal.      The exam was otherwise without abnormality on direct and retroflexion       views. Impression:               - Two 5 to 6 mm polyps in the descending colon and                            in the ascending colon, removed with a cold snare.  Resected and retrieved.                           - The examination was otherwise normal on direct                            and retroflexion views. Moderate Sedation:      Moderate (conscious) sedation was personally administered by an       anesthesia professional. The following parameters were monitored: oxygen       saturation, heart rate, blood pressure, respiratory rate, EKG, adequacy       of pulmonary ventilation, and response to care. Recommendation:           - Patient has a contact number available for                            emergencies. The signs and  symptoms of potential                            delayed complications were discussed with the                            patient. Return to normal activities tomorrow.                            Written discharge instructions were provided to the                            patient.                           - Advance diet as tolerated.                           - Continue present medications.                           - Repeat colonoscopy date to be determined after                            pending pathology results are reviewed for                            surveillance.                           - Return to GI office (date not yet determined). Procedure Code(s):        --- Professional ---                           [phone], Colonoscopy, flexible; with removal of                            tumor(s), polyp(s), or other lesion(s) by snare  technique Diagnosis Code(s):        --- Professional ---                           Z86.010, Personal history of colonic polyps                           D12.4, Benign neoplasm of descending colon                           D12.2, Benign neoplasm of ascending colon CPT copyright 2022 American Medical Association. All rights reserved. The codes documented in this report are preliminary and upon coder review may  be revised to meet current compliance requirements. Ricky Lowe. Eliaz Fout, MD Ricky Ozell Hollingshead, MD 05/21/2024 11:29:32 AM This report has been signed electronically. Number of Addenda: 0

## 2024-05-21 NOTE — H&P (Signed)
 " @LOGO @   Gastroenterology Progress Note    Primary Care Physician:  Duanne Butler DASEN, MD Primary Gastroenterologist:  Dr. Shaaron  Pre-Procedure History & Physical: HPI:  Ricky Lowe is a 74 y.o. male here for surveillance colonoscopy.  History of multiple colonic adenomas removed 2021.  Past Medical History:  Diagnosis Date   Arthritis    Cataract    Colon polyps    Dr. Jennifer   Colon polyps    Complication of anesthesia    Diabetes mellitus without complication (HCC)    Dyslipidemia    Hypertension    PONV (postoperative nausea and vomiting)    Prediabetes     Past Surgical History:  Procedure Laterality Date   CATARACT EXTRACTION W/PHACO Left 12/21/2016   Procedure: CATARACT EXTRACTION PHACO AND INTRAOCULAR LENS PLACEMENT (IOC);  Surgeon: Harrie Agent, MD;  Location: [facility];  Service: Ophthalmology;  Laterality: Left;  CDE: 2.63   CATARACT EXTRACTION W/PHACO Right 01/18/2017   Procedure: CATARACT EXTRACTION PHACO AND INTRAOCULAR LENS PLACEMENT RIGHT EYE;  Surgeon: Harrie Agent, MD;  Location: [facility];  Service: Ophthalmology;  Laterality: Right;  CDE: 4.88   COLONOSCOPY N/A 05/06/2019   Procedure: COLONOSCOPY;  Surgeon: Shaaron Lamar HERO, MD;  Location: [facility];  Service: Endoscopy;  Laterality: N/A;  10:30   JOINT REPLACEMENT  2010 / 2012   rt hip / lt hip   POLYPECTOMY  05/06/2019   Procedure: POLYPECTOMY;  Surgeon: Shaaron Lamar HERO, MD;  Location: [facility];  Service: Endoscopy;;    Prior to Admission medications  Medication Sig Start Date End Date Taking? Authorizing Provider  lisinopril  (ZESTRIL ) 40 MG tablet Take 1 tablet (40 mg total) by mouth daily. 01/16/24  Yes Duanne Butler DASEN, MD  metFORMIN  (GLUCOPHAGE ) 1000 MG tablet TAKE 1 TABLET(1000 MG) BY MOUTH TWICE DAILY WITH A MEAL 02/13/24  Yes Duanne Butler DASEN, MD  rosuvastatin  (CRESTOR ) 5 MG tablet TAKE 1 TABLET(5 MG) BY MOUTH DAILY 05/20/23  Yes Duanne Butler DASEN, MD  empagliflozin  (JARDIANCE ) 25 MG TABS  tablet TAKE 1 TABLET(25 MG) BY MOUTH DAILY BEFORE BREAKFAST 05/21/24   Duanne Butler DASEN, MD    Allergies as of 05/05/2024   (No Known Allergies)    Family History  Problem Relation Age of Onset   Diabetes Mother    Hearing loss Mother    Heart disease Mother    Hyperlipidemia Mother    Stroke Mother    Vision loss Mother    Hypertension Father    Rectal cancer Brother    Arthritis Maternal Grandmother     Social History   Socioeconomic History   Marital status: Married    Spouse name: Not on file   Number of children: Not on file   Years of education: Not on file   Highest education level: Bachelor's degree (e.g., BA, AB, BS)  Occupational History   Not on file  Tobacco Use   Smoking status: Never   Smokeless tobacco: Former    Quit date: 07/19/1989  Vaping Use   Vaping status: Never Used  Substance and Sexual Activity   Alcohol use: Not Currently   Drug use: No   Sexual activity: Yes    Birth control/protection: None  Other Topics Concern   Not on file  Social History Narrative   Not on file   Social Drivers of Health   Tobacco Use: Medium Risk (05/21/2024)   Patient History    Smoking Tobacco Use: Never    Smokeless Tobacco  Use: Former    Passive Exposure: Not on Actuary Strain: Low Risk (01/04/2024)   Overall Financial Resource Strain (CARDIA)    Difficulty of Paying Living Expenses: Not very hard  Food Insecurity: No Food Insecurity (01/04/2024)   Epic    Worried About Programme Researcher, Broadcasting/film/video in the Last Year: Never true    Ran Out of Food in the Last Year: Never true  Transportation Needs: No Transportation Needs (01/04/2024)   Epic    Lack of Transportation (Medical): No    Lack of Transportation (Non-Medical): No  Physical Activity: Insufficiently Active (01/04/2024)   Exercise Vital Sign    Days of Exercise per Week: 2 days    Minutes of Exercise per Session: 30 min  Stress: No Stress Concern Present (01/04/2024)   [employer] of  Occupational Health - Occupational Stress Questionnaire    Feeling of Stress: Not at all  Social Connections: Socially Integrated (01/04/2024)   Social Connection and Isolation Panel    Frequency of Communication with Friends and Family: More than three times a week    Frequency of Social Gatherings with Friends and Family: Once a week    Attends Religious Services: More than 4 times per year    Active Member of [employer] or Organizations: Yes    Attends Banker Meetings: More than 4 times per year    Marital Status: Married  Catering Manager Violence: Not At Risk (01/08/2024)   Epic    Fear of Current or Ex-Partner: No    Emotionally Abused: No    Physically Abused: No    Sexually Abused: No  Depression (PHQ2-9): Low Risk (01/08/2024)   Depression (PHQ2-9)    PHQ-2 Score: 0  Alcohol Screen: Low Risk (01/04/2024)   Alcohol Screen    Last Alcohol Screening Score (AUDIT): 0  Housing: Low Risk (01/04/2024)   Epic    Unable to Pay for Housing in the Last Year: No    Number of Times Moved in the Last Year: 0    Homeless in the Last Year: No  Utilities: Not At Risk (01/08/2024)   Epic    Threatened with loss of utilities: No  Health Literacy: Adequate Health Literacy (01/08/2024)   B1300 Health Literacy    Frequency of need for help with medical instructions: Never    Review of Systems   See HPI, otherwise negative ROS  Physical Exam: BP 135/62   Pulse 83   Temp 97.6 F (36.4 C) (Oral)   Resp 13   Ht 5' 11 (1.803 m)   Wt 95.7 kg   SpO2 97%   BMI 29.43 kg/m  General:   Alert,  Well-developed, well-nourished, pleasant and cooperative in NAD Neck:  Supple; no masses or thyromegaly. No significant cervical adenopathy. Lungs:  Clear throughout to auscultation.   No wheezes, crackles, or rhonchi. No acute distress. Heart:  Regular rate and rhythm; no murmurs, clicks, rubs,  or gallops. Abdomen: Non-distended, normal bowel sounds.  Soft and nontender without appreciable  mass or hepatosplenomegaly.    Impression/Plan:   74 year old gentleman history of colonic adenomas removed previously here for surveillance colonoscopy.  The risks, benefits, limitations, alternatives and imponderables have been reviewed with the patient. Questions have been answered. All parties are agreeable.      Notice: This dictation was prepared with Dragon dictation along with smaller phrase technology. Any transcriptional errors that result from this process are unintentional and may not be corrected upon review.  "

## 2024-05-21 NOTE — Anesthesia Procedure Notes (Signed)
 Date/Time: 05/21/2024 1:01 AM  Performed by: Barbarann Verneita RAMAN, CRNAPre-anesthesia Checklist: Patient identified, Emergency Drugs available, Suction available, Timeout performed and Patient being monitored Patient Re-evaluated:Patient Re-evaluated prior to induction Oxygen Delivery Method: Nasal Cannula

## 2024-05-21 NOTE — Transfer of Care (Signed)
 Immediate Anesthesia Transfer of Care Note  Patient: Ricky Lowe  Procedure(s) Performed: COLONOSCOPY COLONOSCOPY, WITH POLYPECTOMY  Patient Location: Endoscopy Unit  Anesthesia Type:MAC  Level of Consciousness: awake and patient cooperative  Airway & Oxygen Therapy: Patient Spontanous Breathing  Post-op Assessment: Report given to RN and Post -op Vital signs reviewed and stable  Post vital signs: Reviewed and stable  Last Vitals:  Vitals Value Taken Time  BP 103/59 05/21/24 11:33  Temp 36.5 C 05/21/24 11:29  Pulse 71 05/21/24 11:33  Resp 15 05/21/24 11:33  SpO2 96 % 05/21/24 11:33    Last Pain:  Vitals:   05/21/24 1129  TempSrc: Oral  PainSc: 0-No pain      Patients Stated Pain Goal: 5 (05/21/24 0928)  Complications: No notable events documented.

## 2024-05-21 NOTE — Discharge Instructions (Addendum)
  Colonoscopy Discharge Instructions  Read the instructions outlined below and refer to this sheet in the next few weeks. These discharge instructions provide you with general information on caring for yourself after you leave the hospital. Your doctor may also give you specific instructions. While your treatment has been planned according to the most current medical practices available, unavoidable complications occasionally occur. If you have any problems or questions after discharge, call Dr. Riley Cheadle at [phone]. ACTIVITY You may resume your regular activity, but move at a slower pace for the next 24 hours.  Take frequent rest periods for the next 24 hours.  Walking will help get rid of the air and reduce the bloated feeling in your belly (abdomen).  No driving for 24 hours (because of the medicine (anesthesia) used during the test).   Do not sign any important legal documents or operate any machinery for 24 hours (because of the anesthesia used during the test).  NUTRITION Drink plenty of fluids.  You may resume your normal diet as instructed by your doctor.  Begin with a light meal and progress to your normal diet. Heavy or fried foods are harder to digest and may make you feel sick to your stomach (nauseated).  Avoid alcoholic beverages for 24 hours or as instructed.  MEDICATIONS You may resume your normal medications unless your doctor tells you otherwise.  WHAT YOU CAN EXPECT TODAY Some feelings of bloating in the abdomen.  Passage of more gas than usual.  Spotting of blood in your stool or on the toilet paper.  IF YOU HAD POLYPS REMOVED DURING THE COLONOSCOPY: No aspirin products for 7 days or as instructed.  No alcohol for 7 days or as instructed.  Eat a soft diet for the next 24 hours.  FINDING OUT THE RESULTS OF YOUR TEST Not all test results are available during your visit. If your test results are not back during the visit, make an appointment with your caregiver to find out the  results. Do not assume everything is normal if you have not heard from your caregiver or the medical facility. It is important for you to follow up on all of your test results.  SEEK IMMEDIATE MEDICAL ATTENTION IF: You have more than a spotting of blood in your stool.  Your belly is swollen (abdominal distention).  You are nauseated or vomiting.  You have a temperature over 101.  You have abdominal pain or discomfort that is severe or gets worse throughout the day.    2 polyps found and removed today  Further recommendations to follow pending review of pathology report

## 2024-05-21 NOTE — Anesthesia Preprocedure Evaluation (Signed)
"                                    Anesthesia Evaluation  Patient identified by MRN, date of birth, ID band Patient awake    Reviewed: Allergy & Precautions, H&P , NPO status , Patient's Chart, lab work & pertinent test results, reviewed documented beta blocker date and time   History of Anesthesia Complications (+) PONV and history of anesthetic complications  Airway Mallampati: II  TM Distance: >3 FB Neck ROM: full    Dental no notable dental hx.    Pulmonary neg pulmonary ROS   Pulmonary exam normal breath sounds clear to auscultation       Cardiovascular Exercise Tolerance: Good hypertension,  Rhythm:regular Rate:Normal     Neuro/Psych negative neurological ROS  negative psych ROS   GI/Hepatic negative GI ROS, Neg liver ROS,,,  Endo/Other  diabetes    Renal/GU negative Renal ROS  negative genitourinary   Musculoskeletal   Abdominal   Peds  Hematology negative hematology ROS (+)   Anesthesia Other Findings   Reproductive/Obstetrics negative OB ROS                              Anesthesia Physical Anesthesia Plan  ASA: 2  Anesthesia Plan: MAC   Post-op Pain Management:    Induction:   PONV Risk Score and Plan: Propofol  infusion  Airway Management Planned:   Additional Equipment:   Intra-op Plan:   Post-operative Plan:   Informed Consent: I have reviewed the patients History and Physical, chart, labs and discussed the procedure including the risks, benefits and alternatives for the proposed anesthesia with the patient or authorized representative who has indicated his/her understanding and acceptance.     Dental Advisory Given  Plan Discussed with: CRNA  Anesthesia Plan Comments:         Anesthesia Quick Evaluation  "

## 2024-05-22 LAB — SURGICAL PATHOLOGY

## 2024-12-04 ENCOUNTER — Encounter (INDEPENDENT_AMBULATORY_CARE_PROVIDER_SITE_OTHER): Admitting: Ophthalmology

## 2025-01-13 ENCOUNTER — Ambulatory Visit
